# Patient Record
Sex: Female | Born: 1945 | Race: Black or African American | Hispanic: No | Marital: Single | Smoking: Former smoker
Health system: Southern US, Community
[De-identification: ages and names within clinical notes are randomized; demographics above are authoritative.]

## PROBLEM LIST (undated history)

## (undated) DIAGNOSIS — E538 Deficiency of other specified B group vitamins: Secondary | ICD-10-CM

## (undated) DIAGNOSIS — E785 Hyperlipidemia, unspecified: Secondary | ICD-10-CM

## (undated) DIAGNOSIS — F17200 Nicotine dependence, unspecified, uncomplicated: Secondary | ICD-10-CM

## (undated) DIAGNOSIS — F0390 Unspecified dementia without behavioral disturbance: Secondary | ICD-10-CM

## (undated) DIAGNOSIS — J984 Other disorders of lung: Secondary | ICD-10-CM

## (undated) DIAGNOSIS — R7989 Other specified abnormal findings of blood chemistry: Secondary | ICD-10-CM

## (undated) HISTORY — DX: Deficiency of other specified B group vitamins: E53.8

## (undated) HISTORY — DX: Nicotine dependence, unspecified, uncomplicated: F17.200

## (undated) HISTORY — DX: Other specified abnormal findings of blood chemistry: R79.89

## (undated) HISTORY — PX: ROTATOR CUFF REPAIR: SHX139

## (undated) HISTORY — DX: Hyperlipidemia, unspecified: E78.5

## (undated) HISTORY — DX: Unspecified dementia, unspecified severity, without behavioral disturbance, psychotic disturbance, mood disturbance, and anxiety: F03.90

## (undated) HISTORY — DX: Other disorders of lung: J98.4

---

## 1999-02-08 ENCOUNTER — Ambulatory Visit (HOSPITAL_COMMUNITY): Admission: RE | Admit: 1999-02-08 | Discharge: 1999-02-08 | Payer: Self-pay | Admitting: Family Medicine

## 1999-02-08 ENCOUNTER — Encounter: Payer: Self-pay | Admitting: Family Medicine

## 1999-02-22 ENCOUNTER — Encounter: Admission: RE | Admit: 1999-02-22 | Discharge: 1999-03-29 | Payer: Self-pay | Admitting: *Deleted

## 1999-03-28 ENCOUNTER — Ambulatory Visit (HOSPITAL_COMMUNITY): Admission: RE | Admit: 1999-03-28 | Discharge: 1999-03-28 | Payer: Self-pay

## 2002-08-20 ENCOUNTER — Ambulatory Visit (HOSPITAL_COMMUNITY): Admission: RE | Admit: 2002-08-20 | Discharge: 2002-08-20 | Payer: Self-pay | Admitting: Internal Medicine

## 2002-08-20 ENCOUNTER — Encounter: Payer: Self-pay | Admitting: *Deleted

## 2003-07-11 ENCOUNTER — Other Ambulatory Visit: Admission: RE | Admit: 2003-07-11 | Discharge: 2003-07-11 | Payer: Self-pay | Admitting: Family Medicine

## 2009-08-25 ENCOUNTER — Observation Stay (HOSPITAL_COMMUNITY): Admission: EM | Admit: 2009-08-25 | Discharge: 2009-08-27 | Payer: Self-pay | Admitting: Emergency Medicine

## 2009-08-25 ENCOUNTER — Encounter (INDEPENDENT_AMBULATORY_CARE_PROVIDER_SITE_OTHER): Payer: Self-pay | Admitting: Internal Medicine

## 2009-08-25 ENCOUNTER — Ambulatory Visit: Payer: Self-pay | Admitting: Vascular Surgery

## 2009-08-25 ENCOUNTER — Ambulatory Visit: Payer: Self-pay | Admitting: Cardiology

## 2009-08-27 ENCOUNTER — Encounter: Payer: Self-pay | Admitting: Cardiovascular Disease

## 2009-09-22 DIAGNOSIS — J449 Chronic obstructive pulmonary disease, unspecified: Secondary | ICD-10-CM | POA: Insufficient documentation

## 2009-09-22 DIAGNOSIS — R55 Syncope and collapse: Secondary | ICD-10-CM | POA: Insufficient documentation

## 2009-09-22 DIAGNOSIS — F17200 Nicotine dependence, unspecified, uncomplicated: Secondary | ICD-10-CM

## 2009-09-29 ENCOUNTER — Encounter (INDEPENDENT_AMBULATORY_CARE_PROVIDER_SITE_OTHER): Payer: Self-pay | Admitting: *Deleted

## 2010-03-06 NOTE — Letter (Signed)
Summary: [facility]   Imported By: Roderic Ovens 09/11/2009 12:16:20  _____________________________________________________________________  External Attachment:    Type:   Image     Comment:   External Document  Appended Document: [facility]  make sure patient gets a 30 day event monitor and F/U with EPS within 4 weeks  Appended Document: [facility]  Left message to call back    Appended Document: [facility]  unable to reach pt or leave a message

## 2010-03-06 NOTE — Letter (Signed)
Summary: Appointment - Missed  [facility], Main Office  [address]   [place], [place] [address]   Phone: [phone]  Fax: [phone]     September 29, 2009 MRN: [id number]   SUSETTE SEMINARA [address] [place], [place]  [address]   Dear Ms. Vigorito,  Our records indicate you missed your appointment on August 23rd, 2011, with Dr.  Ladona Ridgel. It is very important that we reach you to reschedule this appointment. We look forward to participating in your health care needs. Please contact us at the number listed above at your earliest convenience to reschedule this appointment.     Sincerely,    Glass blower/designer

## 2010-04-21 LAB — COMPREHENSIVE METABOLIC PANEL
AST: 15 U/L (ref 0–37)
AST: 19 U/L (ref 0–37)
Albumin: 3.7 g/dL (ref 3.5–5.2)
BUN: 11 mg/dL (ref 6–23)
CO2: 24 mEq/L (ref 19–32)
Chloride: 105 mEq/L (ref 96–112)
Creatinine, Ser: 0.94 mg/dL (ref 0.4–1.2)
Creatinine, Ser: 1.05 mg/dL (ref 0.4–1.2)
GFR calc Af Amer: >60 mL/min (ref >60)
GFR calc Af Amer: >60 mL/min (ref >60)
GFR calc non Af Amer: >60 mL/min (ref >60)
Glucose, Bld: 150 mg/dL — ABNORMAL HIGH (ref 70–99)
Total Bilirubin: 1.5 mg/dL — ABNORMAL HIGH (ref 0.3–1.2)
Total Protein: 7.1 g/dL (ref 6.0–8.3)

## 2010-04-21 LAB — CBC
HCT: 39.9 % (ref 36.0–46.0)
Hemoglobin: 13.4 g/dL (ref 12.0–15.0)
MCH: 32.4 pg (ref 26.0–34.0)
MCH: 32.8 pg (ref 26.0–34.0)
MCV: 96.1 fL (ref 78.0–100.0)
MCV: 96.1 fL (ref 78.0–100.0)
Platelets: 242 10*3/uL (ref 150–400)
RBC: 4.15 MIL/uL (ref 3.87–5.11)
RDW: 12.4 % (ref 11.5–15.5)
WBC: 9 10*3/uL (ref 4.0–10.5)

## 2010-04-21 LAB — CARDIAC PANEL(CRET KIN+CKTOT+MB+TROPI)
CK, MB: 0.6 ng/mL (ref 0.3–4.0)
Relative Index: INVALID (ref 0.0–2.5)
Relative Index: INVALID (ref 0.0–2.5)
Total CK: 59 U/L (ref 7–177)
Troponin I: 0.01 ng/mL (ref 0.00–0.06)

## 2010-04-21 LAB — POCT CARDIAC MARKERS
CKMB, poc: <1 ng/mL — ABNORMAL LOW (ref 1.0–8.0)
CKMB, poc: <1 ng/mL — ABNORMAL LOW (ref 1.0–8.0)
Myoglobin, poc: 92.6 ng/mL (ref 12–200)
Troponin i, poc: <0.05 ng/mL (ref 0.00–0.09)

## 2010-04-21 LAB — DIFFERENTIAL
Eosinophils Relative: 1 % (ref 0–5)
Lymphocytes Relative: 33 % (ref 12–46)
Lymphs Abs: 3 10*3/uL (ref 0.7–4.0)
Monocytes Absolute: 0.6 10*3/uL (ref 0.1–1.0)
Monocytes Relative: 7 % (ref 3–12)
Neutro Abs: 5.3 10*3/uL (ref 1.7–7.7)

## 2015-03-01 ENCOUNTER — Other Ambulatory Visit: Payer: Self-pay

## 2015-03-01 DIAGNOSIS — R634 Abnormal weight loss: Secondary | ICD-10-CM | POA: Insufficient documentation

## 2015-03-01 DIAGNOSIS — J410 Simple chronic bronchitis: Secondary | ICD-10-CM | POA: Insufficient documentation

## 2015-03-01 DIAGNOSIS — G3184 Mild cognitive impairment, so stated: Secondary | ICD-10-CM | POA: Insufficient documentation

## 2015-03-01 DIAGNOSIS — Z1231 Encounter for screening mammogram for malignant neoplasm of breast: Secondary | ICD-10-CM

## 2015-03-01 DIAGNOSIS — Z7189 Other specified counseling: Secondary | ICD-10-CM | POA: Insufficient documentation

## 2015-03-08 DIAGNOSIS — E782 Mixed hyperlipidemia: Secondary | ICD-10-CM | POA: Insufficient documentation

## 2015-03-09 ENCOUNTER — Ambulatory Visit: Payer: Self-pay

## 2015-04-14 DIAGNOSIS — M8580 Other specified disorders of bone density and structure, unspecified site: Secondary | ICD-10-CM | POA: Insufficient documentation

## 2015-09-26 ENCOUNTER — Ambulatory Visit: Payer: Self-pay | Admitting: General Surgery

## 2015-09-26 NOTE — H&P (Signed)
History of Present Illness  The patient is a 70 year old female who presents with a colonic polyp. Patient is a 70 year old female who is referred by Dr. Penelope Coop for evaluation of a cecal polyp. Patient had a screening colonoscopy last week. Patient was seen have a 30 mm Like polyp in the cecum. This was biopsied. This revealed tubular adenoma, no high-grade dysplasia or malignancy identified. Patient also had colonic polyps the hepatic flexure, transverse, sigmoid, rectum which revealed to be tubular adenomas no high-grade dysplasia or malignancy identified.  Secondary to the fact that again was unable to completely resect the prominent cecum she is referred for surgical excision.   Allergies ( No Known Drug Allergies08/22/2017  Medication History ( Atorvastatin Calcium (10MG  Tablet, Oral) Active. Donepezil HCl (10MG  Tablet, Oral) Active. Medications Reconciled  Vitals ( 09/26/2015 2:09 PM Weight: 144 lb Height: 65in Body Surface Area: 1.72 m Body Mass Index: 23.96 kg/m  Temp.: 98.60F(Temporal)  Pulse: 79 (Regular)  BP: 126/80 (Sitting, Left Arm, Standard)       Physical Exam  General Mental Status-Alert. General Appearance-Consistent with stated age. Hydration-Well hydrated. Voice-Normal.  Chest and Lung Exam Chest and lung exam reveals -quiet, even and easy respiratory effort with no use of accessory muscles and on auscultation, normal breath sounds, no adventitious sounds and normal vocal resonance. Inspection Chest Wall - Normal. Back - normal.  Cardiovascular Cardiovascular examination reveals -normal heart sounds, regular rate and rhythm with no murmurs and normal pedal pulses bilaterally.  Abdomen Inspection Inspection of the abdomen reveals - No Hernias. Skin - Scar - no surgical scars. Palpation/Percussion Palpation and Percussion of the abdomen reveal - Soft, Non Tender, No Rebound tenderness, No Rigidity (guarding) and No  hepatosplenomegaly. Auscultation Auscultation of the abdomen reveals - Bowel sounds normal.  Musculoskeletal Normal Exam - Left-Upper Extremity Strength Normal and Lower Extremity Strength Normal. Normal Exam - Right-Upper Extremity Strength Normal and Lower Extremity Strength Normal.    Assessment & Plan ( POLYP OF CECUM (D12.0) Impression: 70 year old female with a sessile cecal polyp  1. This time we will proceed to the operating for right hemicolectomy, this will be laparoscopic versus robotically. 2. I discussed with the patient the risks and benefits of the procedure to include but not limited to: Infection, bleeding, damage to any structures, possible need for further surgery, leakage of anastomosis and abscess. Patient was understanding and wishes to proceed. 3. Patient will be given a bowel prep with antibiotics.

## 2015-10-31 ENCOUNTER — Inpatient Hospital Stay (HOSPITAL_COMMUNITY): Admission: RE | Admit: 2015-10-31 | Payer: Medicare Other | Source: Ambulatory Visit | Admitting: General Surgery

## 2015-10-31 ENCOUNTER — Encounter (HOSPITAL_COMMUNITY): Admission: RE | Payer: Self-pay | Source: Ambulatory Visit

## 2015-10-31 SURGERY — LAPAROSCOPIC RIGHT HEMI COLECTOMY
Anesthesia: General

## 2016-07-10 DIAGNOSIS — R928 Other abnormal and inconclusive findings on diagnostic imaging of breast: Secondary | ICD-10-CM | POA: Insufficient documentation

## 2016-07-25 ENCOUNTER — Ambulatory Visit (INDEPENDENT_AMBULATORY_CARE_PROVIDER_SITE_OTHER): Payer: Medicare Other | Admitting: Neurology

## 2016-07-25 ENCOUNTER — Encounter: Payer: Self-pay | Admitting: Neurology

## 2016-07-25 VITALS — BP 114/78 | HR 69 | Ht 65.0 in | Wt 146.0 lb

## 2016-07-25 DIAGNOSIS — F17200 Nicotine dependence, unspecified, uncomplicated: Secondary | ICD-10-CM

## 2016-07-25 DIAGNOSIS — F0150 Vascular dementia without behavioral disturbance: Secondary | ICD-10-CM | POA: Diagnosis not present

## 2016-07-25 DIAGNOSIS — Z789 Other specified health status: Secondary | ICD-10-CM

## 2016-07-25 NOTE — Progress Notes (Signed)
Subjective:    Patient ID: Colleen David is a 71 y.o. female.  HPI     Star Age, MD, PhD [facility] [address] [place], [place] [address]  Dear Colleen David,   I saw your patient, Colleen David, upon your kind request in my neurologic clinic today for initial consultation of her memory loss. The patient is accompanied by her sister Colleen David, youngest sibling) and her cousin today. As you know, Colleen David is a 71 year old right-handed woman with an underlying medical history of vitamin B12 deficiency, smoking, marijuana use (none in the past year), and chronic lung disease, who is reported to have memory loss for the past 9-10 years, per sister, since patient's daughter died at age 31 from a car accident. She has 1 son who lives with her. She is a retired Customer service manager. She still smokes, half a pack per day and has reduced her smoking. Of note, her sister noticed to nicotine patches on her arms today. Patient does not have a history of behavioral disturbance her anger outbursts, no history of delusions, no history of wandering, got locked out one time outside her house.   I reviewed your office note from 05/01/2016, which you kindly included. She was referred to neurocognitive testing at the time. She has been on Namzaric, currently 28/10 mg daily. She is the oldest of a total of 7 siblings, 5 are living. One sister died in infancy from pneumonia, one brother died in his early 60s from a heart attack. She is unable to provide her own history. As I understand, there was concern for appetite loss and depressed mood. Of note, she had a brain MRI without contrast on 08/25/2009 which I reviewed. IMPRESSION:  Mild atrophy and small vessel disease.  No acute intracranial findings. This was done after a syncopal episode while eating. No other hx of syncope since then. She denies frank depression, but sister is concerned about depression.  She has not always  been eating right, had lost weight, now more stable. She no longer drives for about 10 years, no longer cooks for the past few years. Does not like other people's cooking, even her son's. She has not fallen, no VH/AH. She has no family history of dementia. She has been on Namzaric for about a year. She does not always drink enough water. She drinks 4 bottles of Pepsi per day, 12 ounces each. She does not drink any alcohol.  Her Past Medical History Is Significant For: Past Medical History:  Diagnosis Date  . Dementia   . Low vitamin B12 level   . Nicotine dependence   . Restrictive airway disease     Her Past Surgical History Is Significant For: Past Surgical History:  Procedure Laterality Date  . ROTATOR CUFF REPAIR      Her Family History Is Significant For: Family History  Problem Relation Age of Onset  . Lung cancer Mother   . Diabetes Mother   . Hypertension Mother   . Colon cancer Mother   . Alcohol abuse Father   . Hypertension Sister     Her Social History Is Significant For: Social History   Social History  . Marital status: Married    Spouse name: N/A  . Number of children: N/A  . Years of education: N/A   Social History Main Topics  . Smoking status: Not on file  . Smokeless tobacco: Not on file  . Alcohol use Not on file  .  Drug use: Unknown  . Sexual activity: Not on file   Other Topics Concern  . Not on file   Social History Narrative  . No narrative on file    Her Allergies Are:  Not on File:   Her Current Medications Are:  Outpatient Encounter Prescriptions as of 07/25/2016  Medication Sig  . alendronate (FOSAMAX) 70 MG tablet Take 70 mg by mouth once a week. Take with a full glass of water on an empty stomach.  Marland Kitchen aspirin EC 81 MG tablet Take 81 mg by mouth daily.  Marland Kitchen atorvastatin (LIPITOR) 10 MG tablet Take 10 mg by mouth daily.  . Memantine HCl-Donepezil HCl (NAMZARIC) 28-10 MG CP24 Take 1 capsule by mouth daily.  . nicotine (NICODERM CQ -  DOSED IN MG/24 HR) 7 mg/24hr patch Place 7 mg onto the skin daily.  . varenicline (CHANTIX) 1 MG tablet Take 1 mg by mouth 2 (two) times daily.   No facility-administered encounter medications on file as of 07/25/2016.   : Review of Systems:  Out of a complete 14 point review of systems, all are reviewed and negative with the exception of these symptoms as listed below:  Review of Systems  Neurological:       Pt presents today as a new pt with new onset dementia. The sister states that long term memory is intact but that her short term memory is off.     Objective:  Neurological Exam  Physical Exam Physical Examination:   Vitals:   07/25/16 0951  BP: 114/78  Pulse: 69   General Examination: The patient is a very pleasant 71 y.o. female in no acute distress. She appears well-developed and well-nourished and adequately groomed.   HEENT: Normocephalic, atraumatic, pupils are equal, round and reactive to light and accommodation. Funduscopic exam is normal with sharp disc margins noted. Extraocular tracking is good without limitation to gaze excursion or nystagmus noted. Normal smooth pursuit is noted. Hearing is grossly intact. Tympanic membranes are clear bilaterally. Face is symmetric with normal facial animation and normal facial sensation. Speech is clear with no dysarthria noted. There is no hypophonia. There is no lip, neck/head, jaw or voice tremor. Neck is supple with full range of passive and active motion. There are no carotid bruits on auscultation. Oropharynx exam reveals: mild mouth dryness, adequate dental hygiene with dentures on top, few teeth in the bottom, no partials. Mallampati is class III. Tongue protrudes centrally and palate elevates symmetrically.   Chest: Clear to auscultation without wheezing, rhonchi or crackles noted.  Heart: S1+S2+0, regular and normal without murmurs, rubs or gallops noted.   Abdomen: Soft, non-tender and non-distended with normal bowel  sounds appreciated on auscultation.  Extremities: There is no pitting edema in the distal lower extremities bilaterally But nonpitting puffiness around both ankles, slender legs.  Skin: Warm and dry without trophic changes noted.  Musculoskeletal: exam reveals no obvious joint deformities, tenderness or joint swelling or erythema.   Neurologically:  Mental status: The patient is awake, not always fully alert during the appointment, seems to have dosed off one time during the conversation. She is unable to provide her own history. She is quiet, minimally verbal. Answers in few word sentences. She is not fully oriented, oriented to city, county, state, self and season, not otherwise. Speech is clear with normal prosody and enunciation. Thought process is linear. Mood is constricted and affect is blunted.   On 07/25/2016: MMSE: 19/30, CDT: 2/4, AFT: 6/min.   Cranial nerves II -  XII are as described above under HEENT exam. In addition: shoulder shrug is normal with equal shoulder height noted. Motor exam: Normal bulk, strength and tone is noted for age. There is no drift, tremor or rebound. Romberg is not tested for safety. Fine motor skills  are grossly intact for age. She has no intention tremor.   Sensory exam: intact to light touch.  Gait, station and balance: She stands without difficulty, posture is age-appropriate, she walks without difficulty, preserved arm swing.  Assessment and plan:   In summary, Colleen David is a very pleasant 71 y.o.-year old female with an underlying medical history of vitamin B12 deficiency, smoking, marijuana use (none in the past year), and chronic lung disease, who has had memory loss for the past 9 or 10 years. Her history and family history is not compelling for Alzheimer's disease but she does have vascular risk factors and is at risk for vascular dementia without obvious behavioral disturbance, she does appear to be somewhat depressed. She may benefit from  management of depression symptomatically. She has been on Namzaric for the past year and is encouraged to continue with this. She no longer drives or call. I would agree with this. She has good family support, lives with her son. I would like to proceed with a brain MRI without contrast as well as referral to neuropsychology for cognitive testing. She does not appear to have a referral in place for this.  I had a long chat with the patient and her family about my findings and the diagnosis of memory loss and dementia, its prognosis and treatment options. We talked about medical treatments and non-pharmacological approaches. We talked about smoking cessation and about maintaining a healthy lifestyle in general and staying active mentally and physically. I encouraged the patient to eat healthy, exercise daily and keep well hydrated, to keep a scheduled bedtime and wake time routine, to not skip any meals and eat healthy snacks in between meals and to have protein with every meal. I stressed the importance of regular exercise, within of course the patient's own mobility limitations. I encouraged the patient to keep up with current events by reading the news paper or watching the news and to do word puzzles, or if feasible, to go on [email or web address].   As far as further diagnostic testing is concerned, I suggested the following: MRI brain without contrast, formal memory testing in the form of neuropsychological evaluation.  As far as medications are concerned, I recommended the following at this time: no change. She is advised to quit smoking, she is furthermore advised to not put more than 1 patch of nicotine on her body and she is encouraged to drink more water, less soda. I advised her to try to walk daily for exercise. I suggested a 4 month checkup, she can see one of our nurse practitioners at the time. I answered all their questions today and the patient and her sister Colleen David and her cousin were in agreement.    Thank you very much for allowing me to participate in the care of this nice patient. If I can be of any further assistance to you please do not hesitate to call me at [phone].  Sincerely,   Star Age, MD, PhD

## 2016-07-25 NOTE — Patient Instructions (Addendum)
Please stop smoking.  Please drink more water, less soda.  Exercise daily in the form of walking.  We will do a brain scan, called MRI and call you with the test results. We will have to schedule you for this on a separate date. This test requires authorization from your insurance, and we will take care of the insurance process. We will request a formal neuropsychological test (aka cognitive testing) for your memory complaints. This requires a referral to a trained and licensed neuropsychologist and will be a separate appointment at a different clinic.  For symptoms of depression, talk to Neoma Laming about treatment options.  Continue current medications. Try to stay active mentally and physically.

## 2016-09-30 DIAGNOSIS — E538 Deficiency of other specified B group vitamins: Secondary | ICD-10-CM | POA: Insufficient documentation

## 2016-11-26 ENCOUNTER — Ambulatory Visit: Payer: Medicare Other | Admitting: Adult Health

## 2017-05-19 ENCOUNTER — Other Ambulatory Visit: Payer: Self-pay | Admitting: Nurse Practitioner

## 2017-05-19 DIAGNOSIS — R921 Mammographic calcification found on diagnostic imaging of breast: Secondary | ICD-10-CM

## 2017-05-26 ENCOUNTER — Ambulatory Visit
Admission: RE | Admit: 2017-05-26 | Discharge: 2017-05-26 | Disposition: A | Discharge status: Home / Self Care | Payer: Medicare Other | Source: Ambulatory Visit | Attending: Nurse Practitioner | Admitting: Nurse Practitioner

## 2017-05-26 DIAGNOSIS — R921 Mammographic calcification found on diagnostic imaging of breast: Secondary | ICD-10-CM

## 2018-10-16 ENCOUNTER — Other Ambulatory Visit: Payer: Self-pay

## 2018-10-16 ENCOUNTER — Ambulatory Visit (INDEPENDENT_AMBULATORY_CARE_PROVIDER_SITE_OTHER): Payer: Medicare Other | Admitting: Podiatry

## 2018-10-16 ENCOUNTER — Other Ambulatory Visit: Payer: Self-pay | Admitting: Podiatry

## 2018-10-16 ENCOUNTER — Ambulatory Visit (INDEPENDENT_AMBULATORY_CARE_PROVIDER_SITE_OTHER): Payer: Medicare Other

## 2018-10-16 DIAGNOSIS — M2042 Other hammer toe(s) (acquired), left foot: Secondary | ICD-10-CM

## 2018-10-16 DIAGNOSIS — M79609 Pain in unspecified limb: Secondary | ICD-10-CM

## 2018-10-16 DIAGNOSIS — D1723 Benign lipomatous neoplasm of skin and subcutaneous tissue of right leg: Secondary | ICD-10-CM | POA: Diagnosis not present

## 2018-10-16 DIAGNOSIS — M79672 Pain in left foot: Secondary | ICD-10-CM

## 2018-10-16 DIAGNOSIS — D1724 Benign lipomatous neoplasm of skin and subcutaneous tissue of left leg: Secondary | ICD-10-CM

## 2018-10-16 DIAGNOSIS — M79676 Pain in unspecified toe(s): Secondary | ICD-10-CM

## 2018-10-16 DIAGNOSIS — B351 Tinea unguium: Secondary | ICD-10-CM

## 2018-10-16 DIAGNOSIS — M79671 Pain in right foot: Secondary | ICD-10-CM

## 2018-10-16 DIAGNOSIS — M2041 Other hammer toe(s) (acquired), right foot: Secondary | ICD-10-CM

## 2018-11-04 NOTE — Progress Notes (Signed)
  Subjective:  Patient ID: Colleen David, female    DOB: 1945/07/10,  MRN: [id number]  Chief Complaint  Patient presents with  . Foot Problem    Pt guardian states ankles have always been swollen, but does not cause any pain.  . Nail Problem    Nail trim 1-5 bilateral    73 y.o. female presents with the above complaint.  Reports painfully elongated nails to both feet.  Review of Systems: Negative except as noted in the HPI. Denies N/V/F/Ch.  Past Medical History:  Diagnosis Date  . Dementia   . Low vitamin B12 level   . Nicotine dependence   . Restrictive airway disease     Current Outpatient Medications:  .  albuterol (VENTOLIN HFA) 108 (90 Base) MCG/ACT inhaler, Inhale into the lungs., Disp: , Rfl:  .  alendronate (FOSAMAX) 70 MG tablet, Take 70 mg by mouth once a week. Take with a full glass of water on an empty stomach., Disp: , Rfl:  .  aspirin EC 81 MG tablet, Take 81 mg by mouth daily., Disp: , Rfl:  .  atorvastatin (LIPITOR) 10 MG tablet, Take 10 mg by mouth daily., Disp: , Rfl:  .  cyanocobalamin (,VITAMIN B-12,) 1000 MCG/ML injection, Inject into the muscle., Disp: , Rfl:  .  Memantine HCl-Donepezil HCl (NAMZARIC) 28-10 MG CP24, Take 1 capsule by mouth daily., Disp: , Rfl:  .  triamcinolone (KENALOG) 0.025 % ointment, Use bid sparingly to affected areas x 7 days, Disp: , Rfl:  .  nicotine (NICODERM CQ - DOSED IN MG/24 HR) 7 mg/24hr patch, Place 7 mg onto the skin daily., Disp: , Rfl:  .  varenicline (CHANTIX) 1 MG tablet, Take 1 mg by mouth 2 (two) times daily., Disp: , Rfl:   Social History   Tobacco Use  Smoking Status Current Every Day Smoker  . Packs/day: 0.50  . Types: Cigarettes  Smokeless Tobacco Never Used    No Known Allergies Objective:  There were no vitals filed for this visit. There is no height or weight on file to calculate BMI. Constitutional Well developed. Well nourished.  Vascular Dorsalis pedis pulses palpable bilaterally. Posterior  tibial pulses palpable bilaterally. Capillary refill normal to all digits.  No cyanosis or clubbing noted. Pedal hair growth normal.  Neurologic Normal speech. Oriented to person, place, and time. Epicritic sensation to light touch grossly present bilaterally.  Dermatologic Nails elongated dystrophic pain to palpation No open wounds. No skin lesions.  Orthopedic: Normal joint ROM without pain or crepitus bilaterally. No visible deformities. No bony tenderness.   Radiographs: None Assessment:   1. Pain due to onychomycosis of nail   2. Hammertoe of left foot   3. Hammertoe of right foot    Plan:  Patient was evaluated and treated and all questions answered.  Onychomycosis with pain -Nails palliatively debridement as below -Educated on self-care  Procedure: Nail Debridement Rationale: Pain Type of Debridement: manual, sharp debridement. Instrumentation: Nail nipper, rotary burr. Number of Nails: 10    Return in about 3 months (around 01/15/2019) for Routine Foot Care (needs ABN next visit).

## 2019-01-15 ENCOUNTER — Ambulatory Visit: Payer: Medicare Other | Admitting: Podiatry

## 2019-03-05 ENCOUNTER — Ambulatory Visit: Payer: Medicare Other | Admitting: Podiatry

## 2019-06-02 ENCOUNTER — Ambulatory Visit: Payer: Self-pay | Admitting: Cardiology

## 2019-06-08 ENCOUNTER — Ambulatory Visit: Payer: Medicare Other | Admitting: Cardiology

## 2019-06-08 ENCOUNTER — Other Ambulatory Visit: Payer: Self-pay

## 2019-06-08 ENCOUNTER — Encounter: Payer: Self-pay | Admitting: Cardiology

## 2019-06-08 VITALS — BP 108/62 | HR 70 | Temp 98.6°F | Resp 18 | Ht 65.0 in | Wt 146.4 lb

## 2019-06-08 DIAGNOSIS — J449 Chronic obstructive pulmonary disease, unspecified: Secondary | ICD-10-CM

## 2019-06-08 DIAGNOSIS — I452 Bifascicular block: Secondary | ICD-10-CM

## 2019-06-08 DIAGNOSIS — Z87891 Personal history of nicotine dependence: Secondary | ICD-10-CM

## 2019-06-08 DIAGNOSIS — E782 Mixed hyperlipidemia: Secondary | ICD-10-CM

## 2019-06-08 DIAGNOSIS — R0602 Shortness of breath: Secondary | ICD-10-CM

## 2019-06-08 NOTE — Progress Notes (Signed)
Date:  06/08/2019   ID:  Demetric, [place] 20-Apr-1945, MRN [id number]  PCP:  Smothers, Andree Elk, NP  Cardiologist:  Rex Kras, DO, [facility] (established care 06/08/2019)  REASON FOR CONSULT: Shortness of breath and abnormal EKG  REQUESTING PHYSICIAN:  Smothers, Andree Elk, NP [address] [place],  [place] [address]  Chief Complaint  Patient presents with  . Shortness of Breath  . Abnormal ECG  . New Patient (Initial Visit)    Referral from Eldridge Abrahams, FNP    HPI  Colleen David is a 74 y.o. female who is being seen today for the evaluation of shortness of breath and abnormal EKG at the request of Smothers, Andree Elk, NP. Patient's past medical history and cardiac risk factors include: Former smoker, COPD, right bundle branch block, left anterior fascicular block, mixed hyperlipidemia, postmenopausal female, advanced age.  Patient is accompanied by her Preston Fleeting at today's office visit.  Patient provides verbal consent in regards to having her present during the office encounter.  Patient is referred to the office at the request of her primary provider for the above-mentioned chief complaint and reason of consultation.  According to the patient in March 2021 she was working in the yard and sat down and she thought that she had dozed off but when her aide came by to check up on her she was difficult to arouse.  Patient's aide got concerned called EMS.  She had an EKG and vitals performed and did not go ER for further evaluation.  During that time she did not have any chest pain or shortness of breath at rest or with effort related activities.  And since that episode she has not had any additional events.  At the time of the evaluation today patient denies any chest pain or shortness of breath at rest or with effort related activities.  Patient tries to make an effort to walk a block every day without any effort related symptoms.  Denies prior history of coronary  artery disease, myocardial infarction, congestive heart failure, deep venous thrombosis, pulmonary embolism, stroke, transient ischemic attack.  FUNCTIONAL STATUS: Goes everyday with an aide which is about a block.    ALLERGIES: No Known Allergies  MEDICATION LIST PRIOR TO VISIT: Current Meds  Medication Sig  . albuterol (VENTOLIN HFA) 108 (90 Base) MCG/ACT inhaler Inhale into the lungs.  Marland Kitchen alendronate (FOSAMAX) 70 MG tablet Take 70 mg by mouth once a week. Take with a full glass of water on an empty stomach.  Marland Kitchen aspirin EC 81 MG tablet Take 81 mg by mouth daily.  Marland Kitchen atorvastatin (LIPITOR) 10 MG tablet Take 10 mg by mouth daily.  . Cyanocobalamin (VITAMIN B-12) 1000 MCG SUBL Place under the tongue every 30 (thirty) days.  . Memantine HCl-Donepezil HCl (NAMZARIC) 28-10 MG CP24 Take 1 capsule by mouth daily.  . Multiple Vitamin (MULTIVITAMIN ADULT PO) Take 1 tablet by mouth daily.     PAST MEDICAL HISTORY: Past Medical History:  Diagnosis Date  . Dementia ([place])   . Hyperlipidemia   . Low vitamin B12 level   . Nicotine dependence   . Restrictive airway disease     PAST SURGICAL HISTORY: Past Surgical History:  Procedure Laterality Date  . ROTATOR CUFF REPAIR      FAMILY HISTORY: The patient family history includes Alcohol abuse in her father; Colon cancer in her mother; Diabetes in her mother; Hypertension in her mother, sister, and sister; Lung cancer in  her mother.  SOCIAL HISTORY:  The patient  reports that she quit smoking about 16 months ago. Her smoking use included cigarettes. She has a 27.50 pack-year smoking history. She has never used smokeless tobacco. She reports that she does not drink alcohol or use drugs.  REVIEW OF SYSTEMS: Review of Systems  Constitution: Negative for chills and fever.  HENT: Negative for hoarse voice and nosebleeds.   Eyes: Negative for discharge, double vision and pain.  Cardiovascular: Negative for chest pain, claudication, dyspnea on  exertion, leg swelling, near-syncope, orthopnea, palpitations, paroxysmal nocturnal dyspnea and syncope.  Respiratory: Positive for shortness of breath. Negative for hemoptysis.   Musculoskeletal: Negative for muscle cramps and myalgias.  Gastrointestinal: Negative for abdominal pain, constipation, diarrhea, hematemesis, hematochezia, melena, nausea and vomiting.  Neurological: Negative for dizziness and light-headedness.    PHYSICAL EXAM: Vitals with BMI 06/08/2019 07/25/2016  Height '5\' 5"'  '5\' 5"'   Weight 146 lbs 6 oz 146 lbs  BMI 01.60 10.9  Systolic 323 557  Diastolic 62 78  Pulse 70 69   CONSTITUTIONAL: Well-developed and well-nourished. No acute distress.  SKIN: Skin is warm and dry. No rash noted. No cyanosis. No pallor. No jaundice HEAD: Normocephalic and atraumatic.  EYES: No scleral icterus MOUTH/THROAT: Moist oral membranes.  NECK: No JVD present. No thyromegaly noted. No carotid bruits  LYMPHATIC: No visible cervical adenopathy.  CHEST Normal respiratory effort. No intercostal retractions  LUNGS: Clear to auscultation bilaterally.  No stridor. No wheezes. No rales.  CARDIOVASCULAR: Regular rate and rhythm, positive S1-S2, no murmurs rubs or gallops appreciated ABDOMINAL: Soft, nontender, nondistended, positive bowel sounds in all 4 quadrants, no apparent ascites.  EXTREMITIES: No peripheral edema  HEMATOLOGIC: No significant bruising NEUROLOGIC: Oriented to person, place, and time. Nonfocal. Normal muscle tone.  PSYCHIATRIC: Normal mood and affect. Normal behavior. Cooperative  CARDIAC DATABASE: EKG: 06/08/2019: Normal sinus rhythm, 77 bpm, left axis deviation, right bundle branch block, left anterior fascicular block, left atrial enlargement, ST-T changes secondary to RBBB.  Echocardiogram: None  Stress Testing: None  Heart Catheterization: None  LABORATORY DATA:  External Labs: [facility] Collected: 05/13/2019 Creatinine 1.36 mg/dL. eGFR:  45 mL/min per 1.73 m Lipid profile: Total cholesterol 174, triglycerides 72, HDL 60, LDL 100, non-HDL 114  IMPRESSION:    ICD-10-CM   1. Shortness of breath  R06.02 EKG 12-Lead    PCV ECHOCARDIOGRAM COMPLETE    PCV MYOCARDIAL PERFUSION WITH LEXISCAN  2. RBBB (right bundle branch block with left anterior fascicular block)  I45.2 PCV MYOCARDIAL PERFUSION WITH LEXISCAN  3. Mixed hyperlipidemia  E78.2   4. Former smoker  Z87.891   5. Chronic obstructive pulmonary disease, unspecified COPD type ([place])  J44.9      RECOMMENDATIONS: Colleen David is a 74 y.o. female whose past medical history and cardiac risk factors include: Former smoker, COPD, right bundle branch block, left anterior fascicular block, mixed hyperlipidemia, postmenopausal female, advanced age.  Shortness of breath:  Patient is symptoms of shortness of breath are not effort related and could be secondary to her underlying chronic obstructive pulmonary disease.  However, patient has multiple cardiovascular risk factors for which I would recommend an ischemic evaluation.  Echocardiogram will be ordered to evaluate for structural heart disease and left ventricular systolic function.  Nuclear stress test recommended to evaluate for reversible ischemia.  Mixed hyperlipidemia: Continue statin therapy.  Most recent lipid profile reviewed.  Currently managed by primary team.  Former smoker: Educated on the importance of  continued smoking cessation.  FINAL MEDICATION LIST END OF ENCOUNTER: No orders of the defined types were placed in this encounter.   There are no discontinued medications.   Current Outpatient Medications:  .  albuterol (VENTOLIN HFA) 108 (90 Base) MCG/ACT inhaler, Inhale into the lungs., Disp: , Rfl:  .  alendronate (FOSAMAX) 70 MG tablet, Take 70 mg by mouth once a week. Take with a full glass of water on an empty stomach., Disp: , Rfl:  .  aspirin EC 81 MG tablet, Take 81 mg by mouth daily., Disp: ,  Rfl:  .  atorvastatin (LIPITOR) 10 MG tablet, Take 10 mg by mouth daily., Disp: , Rfl:  .  Cyanocobalamin (VITAMIN B-12) 1000 MCG SUBL, Place under the tongue every 30 (thirty) days., Disp: , Rfl:  .  Memantine HCl-Donepezil HCl (NAMZARIC) 28-10 MG CP24, Take 1 capsule by mouth daily., Disp: , Rfl:  .  Multiple Vitamin (MULTIVITAMIN ADULT PO), Take 1 tablet by mouth daily., Disp: , Rfl:   Orders Placed This Encounter  Procedures  . PCV MYOCARDIAL PERFUSION WITH LEXISCAN  . EKG 12-Lead  . PCV ECHOCARDIOGRAM COMPLETE   --Continue cardiac medications as reconciled in final medication list. --Return in about 6 weeks (around 07/20/2019) for re-evaluation of symptoms., review test results.. Or sooner if needed. --Continue follow-up with your primary care physician regarding the management of your other chronic comorbid conditions.  Patient's questions and concerns were addressed to her satisfaction. She voices understanding of the instructions provided during this encounter.   This note was created using a voice recognition software as a result there may be grammatical errors inadvertently enclosed that do not reflect the nature of this encounter. Every attempt is made to correct such errors.  Rex Kras, [place], [facility]  Pager: [phone] Office: [phone]

## 2019-06-25 ENCOUNTER — Ambulatory Visit: Payer: Medicare Other

## 2019-06-25 ENCOUNTER — Other Ambulatory Visit: Payer: Self-pay

## 2019-06-25 DIAGNOSIS — R0602 Shortness of breath: Secondary | ICD-10-CM

## 2019-06-28 ENCOUNTER — Other Ambulatory Visit: Payer: Self-pay

## 2019-06-28 ENCOUNTER — Ambulatory Visit: Payer: Medicare Other

## 2019-06-28 DIAGNOSIS — R0602 Shortness of breath: Secondary | ICD-10-CM

## 2019-06-28 DIAGNOSIS — I452 Bifascicular block: Secondary | ICD-10-CM

## 2019-07-02 ENCOUNTER — Other Ambulatory Visit: Payer: Medicare Other

## 2019-07-06 ENCOUNTER — Telehealth: Payer: Self-pay

## 2019-07-06 NOTE — Progress Notes (Signed)
Left vm to cb.

## 2019-07-06 NOTE — Telephone Encounter (Signed)
Left vm to cb.

## 2019-07-06 NOTE — Telephone Encounter (Signed)
-----   Message from [place], [place] sent at 07/04/2019 12:22 PM EDT ----- The left ventricular ejection fraction or pumping activity of the heart is within the normal limit.  Mildly impaired relaxation.  Details will be reviewed at the next office visit.

## 2019-07-09 NOTE — Progress Notes (Signed)
2nd attempt : Patient called, NA, LMAM.

## 2019-07-20 ENCOUNTER — Encounter: Payer: Self-pay | Admitting: Cardiology

## 2019-07-20 ENCOUNTER — Other Ambulatory Visit: Payer: Self-pay

## 2019-07-20 ENCOUNTER — Ambulatory Visit: Payer: Medicare Other | Admitting: Cardiology

## 2019-07-20 VITALS — BP 130/60 | HR 50 | Ht 65.0 in | Wt 148.0 lb

## 2019-07-20 DIAGNOSIS — R0602 Shortness of breath: Secondary | ICD-10-CM

## 2019-07-20 DIAGNOSIS — J449 Chronic obstructive pulmonary disease, unspecified: Secondary | ICD-10-CM

## 2019-07-20 DIAGNOSIS — Z87891 Personal history of nicotine dependence: Secondary | ICD-10-CM

## 2019-07-20 DIAGNOSIS — Z712 Person consulting for explanation of examination or test findings: Secondary | ICD-10-CM

## 2019-07-20 DIAGNOSIS — I452 Bifascicular block: Secondary | ICD-10-CM

## 2019-07-20 DIAGNOSIS — E782 Mixed hyperlipidemia: Secondary | ICD-10-CM

## 2019-07-20 NOTE — Progress Notes (Signed)
 Colleen David Date of Birth: 11/08/1945 MRN: [id number] Primary Care Provider:Jones, Penny L, NP Primary Cardiologist: Deano Tomaszewski, DO, FACC (established care 06/08/2019)    Date: 07/20/19 Last Office Visit: 06/08/2019  Chief Complaint  Patient presents with  . Shortness of Breath  . Follow-up    HPI  Colleen David is a 74 y.o. female who presents to the office with a  chief complaint of " reevaluation of symptoms and test results."  Her past medical history and cardiovascular risk factors are: Former smoker, COPD, right bundle branch block, left anterior fascicular block, mixed hyperlipidemia, postmenopausal female, advanced age.   Patient was originally referred to the office for evaluation of shortness of breath and abnormal EKG.   Patient is accompanied by her Sister Colleen David at today's office visit.  Patient provides verbal consent in regards to having her present during the office encounter.  Since then patient was recommended to undergo an ischemic evaluation.  She underwent an echocardiogram and nuclear stress test since last office visit and results were reviewed with her in great detail at today's visit.  And noted below for further reference.  Since last office visit her shortness of breath remains at baseline.  She does not have any chest pain at rest or with effort related activities.  Denies prior history of coronary artery disease, myocardial infarction, congestive heart failure, deep venous thrombosis, pulmonary embolism, stroke, transient ischemic attack.  ALLERGIES: No Known Allergies  MEDICATION LIST PRIOR TO VISIT: Current Meds  Medication Sig  . albuterol (VENTOLIN HFA) 108 (90 Base) MCG/ACT inhaler Inhale into the lungs.  . alendronate (FOSAMAX) 70 MG tablet Take 70 mg by mouth once a week. Take with a full glass of water on an empty stomach.  . aspirin EC 81 MG tablet Take 81 mg by mouth daily.  . atorvastatin (LIPITOR) 10 MG tablet Take 10 mg by  mouth daily.  . Cyanocobalamin (VITAMIN B-12) 1000 MCG SUBL Place under the tongue every 30 (thirty) days.  . Memantine HCl-Donepezil HCl (NAMZARIC) 28-10 MG CP24 Take 1 capsule by mouth daily.  . Multiple Vitamin (MULTIVITAMIN ADULT PO) Take 1 tablet by mouth daily.     PAST MEDICAL HISTORY: Past Medical History:  Diagnosis Date  . Dementia ([facility])   . Hyperlipidemia   . Low vitamin B12 level   . Nicotine dependence   . Restrictive airway disease     PAST SURGICAL HISTORY: Past Surgical History:  Procedure Laterality Date  . ROTATOR CUFF REPAIR      FAMILY HISTORY: The patient family history includes Alcohol abuse in her father; Colon cancer in her mother; Diabetes in her mother; Hypertension in her mother, sister, and sister; Lung cancer in her mother.  SOCIAL HISTORY:  The patient  reports that she quit smoking about 17 months ago. Her smoking use included cigarettes. She has a 27.50 pack-year smoking history. She has never used smokeless tobacco. She reports that she does not drink alcohol and does not use drugs.  REVIEW OF SYSTEMS: Review of Systems  Constitutional: Negative for chills and fever.  HENT: Negative for hoarse voice and nosebleeds.   Eyes: Negative for discharge, double vision and pain.  Cardiovascular: Negative for chest pain, claudication, dyspnea on exertion, leg swelling, near-syncope, orthopnea, palpitations, paroxysmal nocturnal dyspnea and syncope.  Respiratory: Positive for shortness of breath (chronic and stable). Negative for hemoptysis.   Musculoskeletal: Negative for muscle cramps and myalgias.  Gastrointestinal: Negative for abdominal pain, constipation, diarrhea, hematemesis, hematochezia, melena,   nausea and vomiting.  Neurological: Negative for dizziness and light-headedness.   PHYSICAL EXAM: Vitals with BMI 07/20/2019 06/08/2019 07/25/2016  Height 5' 5" 5' 5" 5' 5"  Weight 148 lbs 146 lbs 6 oz 146 lbs  BMI 24.63 77.82 42.3  Systolic 536 144 315    Diastolic 60 62 78  Pulse 50 70 69   CONSTITUTIONAL: Well-developed and well-nourished. No acute distress.  SKIN: Skin is warm and dry. No rash noted. No cyanosis. No pallor. No jaundice HEAD: Normocephalic and atraumatic.  EYES: No scleral icterus MOUTH/THROAT: Moist oral membranes.  NECK: No JVD present. No thyromegaly noted. No carotid bruits  LYMPHATIC: No visible cervical adenopathy.  CHEST Normal respiratory effort. No intercostal retractions  LUNGS: Clear to auscultation bilaterally.  No stridor. No wheezes. No rales.  CARDIOVASCULAR: Regular rate and rhythm, positive S1-S2, no murmurs rubs or gallops appreciated ABDOMINAL: Soft, nontender, nondistended, positive bowel sounds in all 4 quadrants, no apparent ascites.  EXTREMITIES: No peripheral edema  HEMATOLOGIC: No significant bruising PSYCHIATRIC: Normal mood and affect. Normal behavior. Cooperative  CARDIAC DATABASE: EKG: 06/08/2019: Normal sinus rhythm, 77 bpm, left axis deviation, right bundle branch block, left anterior fascicular block, left atrial enlargement, ST-T changes secondary to RBBB.  Echocardiogram: 06/25/2019:  Normal LV systolic function with EF 61%. Left ventricle cavity is small.  Normal global wall motion. Doppler evidence of grade I (impaired) diastolic dysfunction, normal LAP. No significant valvular abnormalities.  Stress Testing: Lexiscan Tetrofosmin stress test 06/28/2019:  Lexiscan nuclear stress test performed using 1-day protocol.  Normal myocardial perfusion. Stress LVEF 51%.  Low risk study.  Heart Catheterization: None  LABORATORY DATA:  External Labs: [facility] Collected: 05/13/2019 Creatinine 1.36 mg/dL. eGFR: 45 mL/min per 1.73 m Lipid profile: Total cholesterol 174, triglycerides 72, HDL 60, LDL 100, non-HDL 114  IMPRESSION:    ICD-10-CM   1. Encounter to discuss test results  Z71.2   2. RBBB (right bundle branch block with left anterior fascicular  block)  I45.2   3. Mixed hyperlipidemia  E78.2   4. Shortness of breath  R06.02   5. Chronic obstructive pulmonary disease, unspecified COPD type ([place])  J44.9   6. Former smoker  Z87.891      RECOMMENDATIONS: ZUMA HUST is a 74 y.o. female whose past medical history and cardiac risk factors include: Former smoker, COPD, right bundle branch block, left anterior fascicular block, mixed hyperlipidemia, postmenopausal female, advanced age.  Shortness of breath: Chronic and stable  Reviewed the results of the echocardiogram and the nuclear stress test with the patient and her sister at today's office visit.    His symptoms of shortness of breath remained stable and probably most likely secondary to her underlying COPD.    No additional cardiac work-up needed at this time as the patient remains stable.    Mixed hyperlipidemia: Continue statin therapy.  Most recent lipid profile reviewed.  Currently managed by primary team.  Former smoker: Educated on the importance of continued smoking cessation.  FINAL MEDICATION LIST END OF ENCOUNTER: No orders of the defined types were placed in this encounter.   There are no discontinued medications.   Current Outpatient Medications:  .  albuterol (VENTOLIN HFA) 108 (90 Base) MCG/ACT inhaler, Inhale into the lungs., Disp: , Rfl:  .  alendronate (FOSAMAX) 70 MG tablet, Take 70 mg by mouth once a week. Take with a full glass of water on an empty stomach., Disp: , Rfl:  .  aspirin EC 81  MG tablet, Take 81 mg by mouth daily., Disp: , Rfl:  .  atorvastatin (LIPITOR) 10 MG tablet, Take 10 mg by mouth daily., Disp: , Rfl:  .  Cyanocobalamin (VITAMIN B-12) 1000 MCG SUBL, Place under the tongue every 30 (thirty) days., Disp: , Rfl:  .  Memantine HCl-Donepezil HCl (NAMZARIC) 28-10 MG CP24, Take 1 capsule by mouth daily., Disp: , Rfl:  .  Multiple Vitamin (MULTIVITAMIN ADULT PO), Take 1 tablet by mouth daily., Disp: , Rfl:   No orders of the defined  types were placed in this encounter.  --Continue cardiac medications as reconciled in final medication list. --Return in about 6 months (around 01/19/2020) for re-evaluation of symptoms of shortness of breath.. Or sooner if needed. --Continue follow-up with your primary care physician regarding the management of your other chronic comorbid conditions.  Patient's questions and concerns were addressed to her satisfaction. She voices understanding of the instructions provided during this encounter.   This note was created using a voice recognition software as a result there may be grammatical errors inadvertently enclosed that do not reflect the nature of this encounter. Every attempt is made to correct such errors.   , DO, FACC  Pager: [phone] Office: [phone]   

## 2019-09-28 ENCOUNTER — Encounter (HOSPITAL_COMMUNITY): Payer: Self-pay

## 2019-09-28 ENCOUNTER — Emergency Department (HOSPITAL_COMMUNITY)
Admission: EM | Admit: 2019-09-28 | Discharge: 2019-09-28 | Disposition: A | Discharge status: Home / Self Care | Payer: Medicare Other | Attending: Emergency Medicine | Admitting: Emergency Medicine

## 2019-09-28 ENCOUNTER — Other Ambulatory Visit: Payer: Self-pay

## 2019-09-28 ENCOUNTER — Emergency Department (HOSPITAL_COMMUNITY): Payer: Medicare Other

## 2019-09-28 DIAGNOSIS — Z7951 Long term (current) use of inhaled steroids: Secondary | ICD-10-CM | POA: Insufficient documentation

## 2019-09-28 DIAGNOSIS — U071 COVID-19: Secondary | ICD-10-CM

## 2019-09-28 DIAGNOSIS — E785 Hyperlipidemia, unspecified: Secondary | ICD-10-CM | POA: Insufficient documentation

## 2019-09-28 DIAGNOSIS — R319 Hematuria, unspecified: Secondary | ICD-10-CM | POA: Diagnosis not present

## 2019-09-28 DIAGNOSIS — N390 Urinary tract infection, site not specified: Secondary | ICD-10-CM | POA: Diagnosis not present

## 2019-09-28 DIAGNOSIS — Z7982 Long term (current) use of aspirin: Secondary | ICD-10-CM | POA: Diagnosis not present

## 2019-09-28 LAB — URINALYSIS, ROUTINE W REFLEX MICROSCOPIC
Bilirubin Urine: NEGATIVE
Glucose, UA: NEGATIVE mg/dL
Ketones, ur: NEGATIVE mg/dL
Nitrite: NEGATIVE
Protein, ur: NEGATIVE mg/dL
Specific Gravity, Urine: 1.023 (ref 1.005–1.030)
pH: 5 (ref 5.0–8.0)

## 2019-09-28 LAB — COMPREHENSIVE METABOLIC PANEL
ALT: 17 U/L (ref 0–44)
AST: 25 U/L (ref 15–41)
Albumin: 3.7 g/dL (ref 3.5–5.0)
Alkaline Phosphatase: 70 U/L (ref 38–126)
Anion gap: 14 (ref 5–15)
BUN: 23 mg/dL (ref 8–23)
CO2: 19 mmol/L — ABNORMAL LOW (ref 22–32)
Calcium: 9 mg/dL (ref 8.9–10.3)
Chloride: 109 mmol/L (ref 98–111)
Creatinine, Ser: 1.7 mg/dL — ABNORMAL HIGH (ref 0.44–1.00)
GFR calc Af Amer: 34 mL/min — ABNORMAL LOW (ref >60)
GFR calc non Af Amer: 29 mL/min — ABNORMAL LOW (ref >60)
Glucose, Bld: 94 mg/dL (ref 70–99)
Potassium: 3.9 mmol/L (ref 3.5–5.1)
Sodium: 142 mmol/L (ref 135–145)
Total Bilirubin: 0.9 mg/dL (ref 0.3–1.2)
Total Protein: 7.6 g/dL (ref 6.5–8.1)

## 2019-09-28 LAB — CBC WITH DIFFERENTIAL/PLATELET
Abs Immature Granulocytes: 0.03 10*3/uL (ref 0.00–0.07)
Basophils Absolute: 0 10*3/uL (ref 0.0–0.1)
Basophils Relative: 1 %
Eosinophils Absolute: 0.1 10*3/uL (ref 0.0–0.5)
Eosinophils Relative: 2 %
HCT: 48.2 % — ABNORMAL HIGH (ref 36.0–46.0)
Hemoglobin: 14.6 g/dL (ref 12.0–15.0)
Immature Granulocytes: 0 %
Lymphocytes Relative: 36 %
Lymphs Abs: 2.6 10*3/uL (ref 0.7–4.0)
MCH: 30.3 pg (ref 26.0–34.0)
MCHC: 30.3 g/dL (ref 30.0–36.0)
MCV: 100 fL (ref 80.0–100.0)
Monocytes Absolute: 1 10*3/uL (ref 0.1–1.0)
Monocytes Relative: 14 %
Neutro Abs: 3.4 10*3/uL (ref 1.7–7.7)
Neutrophils Relative %: 47 %
Platelets: 205 10*3/uL (ref 150–400)
RBC: 4.82 MIL/uL (ref 3.87–5.11)
RDW: 12.2 % (ref 11.5–15.5)
WBC: 7.3 10*3/uL (ref 4.0–10.5)
nRBC: 0 % (ref 0.0–0.2)

## 2019-09-28 LAB — SARS CORONAVIRUS 2 BY RT PCR (HOSPITAL ORDER, PERFORMED IN ~~LOC~~ HOSPITAL LAB): SARS Coronavirus 2: POSITIVE — AB

## 2019-09-28 MED ORDER — CEPHALEXIN 250 MG PO CAPS
500.0000 mg | ORAL_CAPSULE | Freq: Once | ORAL | Status: AC
  Administered 2019-09-28: 500 mg via ORAL
  Filled 2019-09-28: qty 2

## 2019-09-28 MED ORDER — CEPHALEXIN 500 MG PO CAPS: 500.0000 mg | ORAL_CAPSULE | Freq: Four times a day (QID) | ORAL | 0 refills | Status: AC

## 2019-09-28 NOTE — ED Provider Notes (Signed)
[facility] EMERGENCY DEPARTMENT Provider Note   CSN: [id number] Arrival date & time: 09/28/19  1427     History Chief Complaint  Patient presents with  . Altered Mental Status    Colleen David is a 74 y.o. female.  The history is provided by a relative. No language interpreter was used.  Altered Mental Status Presenting symptoms: behavior changes   Severity:  Moderate Most recent episode:  Today Episode history:  Multiple Timing:  Constant Progression:  Worsening Chronicity:  New Context: not recent change in medication   Associated symptoms: no abdominal pain   Pt's family reports pt has not been herself.  Pt has had urinary incontinent 2-3 times.  Pt has dementia but normally is continent.       Past Medical History:  Diagnosis Date  . Dementia ([place])   . Hyperlipidemia   . Low vitamin B12 level   . Nicotine dependence   . Restrictive airway disease     Patient Active Problem List   Diagnosis Date Noted  . B12 deficiency 09/30/2016  . Abnormality of right breast on screening mammogram 07/10/2016  . Low bone mass 04/14/2015  . Mixed hyperlipidemia 03/08/2015  . Advanced care planning/counseling discussion 03/01/2015  . Mild cognitive impairment with memory loss 03/01/2015  . Simple chronic bronchitis ([place]) 03/01/2015  . Weight loss, unintentional 03/01/2015  . TOBACCO ABUSE 09/22/2009  . COPD 09/22/2009  . SYNCOPE 09/22/2009  . Nicotine dependence, cigarettes, uncomplicated 96/22/2979    Past Surgical History:  Procedure Laterality Date  . ROTATOR CUFF REPAIR       OB History   No obstetric history on file.     Family History  Problem Relation Age of Onset  . Lung cancer Mother   . Diabetes Mother   . Hypertension Mother   . Colon cancer Mother   . Alcohol abuse Father   . Hypertension Sister   . Hypertension Sister     Social History   Tobacco Use  . Smoking status: Former Smoker    Packs/day: 0.50    Years:  55.00    Pack years: 27.50    Types: Cigarettes    Quit date: 2020    Years since quitting: 1.6  . Smokeless tobacco: Never Used  Vaping Use  . Vaping Use: Never used  Substance Use Topics  . Alcohol use: No  . Drug use: No    Home Medications Prior to Admission medications   Medication Sig Start Date End Date Taking? Authorizing Provider  albuterol (VENTOLIN HFA) 108 (90 Base) MCG/ACT inhaler Inhale into the lungs. 11/10/17  Yes [provider]  alendronate (FOSAMAX) 70 MG tablet Take 70 mg by mouth every Sunday. Take with a full glass of water on an empty stomach.    Yes [provider]  aspirin EC 81 MG tablet Take 81 mg by mouth at bedtime.    Yes [provider]  atorvastatin (LIPITOR) 10 MG tablet Take 10 mg by mouth at bedtime.    Yes [provider]  Memantine HCl-Donepezil HCl (NAMZARIC) 28-10 MG CP24 Take 1 capsule by mouth at bedtime.    Yes [provider]  Multiple Vitamin (MULTIVITAMIN ADULT PO) Take 1 tablet by mouth at bedtime.    Yes [provider]    Allergies    Patient has no known allergies.  Review of Systems   Review of Systems  Gastrointestinal: Negative for abdominal pain.  All other systems reviewed and are  negative.   Physical Exam Updated Vital Signs BP (!) 122/59   Pulse 70   Temp 99.2 F (37.3 C) (Oral)   Resp 19   Ht 5\' 6"  (1.676 m)   Wt 68.5 kg   SpO2 98%   BMI 24.37 kg/m   Physical Exam Vitals and nursing note reviewed.  Constitutional:      Appearance: She is well-developed. She is not toxic-appearing.  HENT:     Head: Normocephalic.     Mouth/Throat:     Mouth: Mucous membranes are moist.  Eyes:     Pupils: Pupils are equal, round, and reactive to light.  Cardiovascular:     Rate and Rhythm: Normal rate.  Pulmonary:     Effort: Pulmonary effort is normal.  Abdominal:     General: Abdomen is flat. There is no distension.  Musculoskeletal:        General: Normal range  of motion.     Cervical back: Normal range of motion.  Skin:    General: Skin is warm.  Neurological:     Mental Status: She is alert and oriented to person, place, and time.  Psychiatric:        Mood and Affect: Mood normal.     ED Results / Procedures / Treatments   Labs (all labs ordered are listed, but only abnormal results are displayed) Labs Reviewed  SARS CORONAVIRUS 2 BY RT PCR (HOSPITAL ORDER, [facility]) - Abnormal; Notable for the following components:      Result Value   SARS Coronavirus 2 POSITIVE (*)    All other components within normal limits  URINALYSIS, ROUTINE W REFLEX MICROSCOPIC - Abnormal; Notable for the following components:   Color, Urine AMBER (*)    APPearance CLOUDY (*)    Hgb urine dipstick SMALL (*)    Leukocytes,Ua TRACE (*)    Bacteria, UA FEW (*)    All other components within normal limits  CBC WITH DIFFERENTIAL/PLATELET - Abnormal; Notable for the following components:   HCT 48.2 (*)    All other components within normal limits  COMPREHENSIVE METABOLIC PANEL - Abnormal; Notable for the following components:   CO2 19 (*)    Creatinine, Ser 1.70 (*)    GFR calc non Af Amer 29 (*)    GFR calc Af Amer 34 (*)    All other components within normal limits    EKG EKG Interpretation  Date/Time:  Tuesday September 28 2019 17:33:38 EDT Ventricular Rate:  72 PR Interval:    QRS Duration: 126 QT Interval:  464 QTC Calculation: 508 R Axis:   -84 Text Interpretation: Sinus rhythm RBBB and LAFB Non-specific ST-t changes Since last tracing T wave inversion NOW PRESENT Confirmed by Calvert Cantor [phone]) on 09/28/2019 5:40:19 PM   Radiology DG Chest Port 1 View  Result Date: 09/28/2019 CLINICAL DATA:  Confusion. EXAM: PORTABLE CHEST 1 VIEW COMPARISON:  08/24/2009 FINDINGS: The cardiomediastinal silhouette is unchanged with normal heart size. Aortic atherosclerosis is noted. The lungs are well inflated with mild chronic  interstitial coarsening. No confluent airspace opacity, edema, sizable pleural effusion, or pneumothorax is identified. No acute osseous abnormality is seen. IMPRESSION: No active disease. Electronically Signed   By: Logan Bores M.D.   On: 09/28/2019 16:03    Procedures Procedures (including critical care time)  Medications Ordered in ED Medications - No data to display  ED Course  I have reviewed the triage vital signs and the nursing notes.  Pertinent labs & imaging results that were available during my care of the patient were reviewed by me and considered in my medical decision making (see chart for details).    MDM Rules/Calculators/A&P                          MDM:  Pt is covid positive.  She has been immunized.  Urine shows 11-20 wbc's.  I suspect uti is the cause of her incontinence.  Family counseled on results.  Urine culture ordered.  Final Clinical Impression(s) / ED Diagnoses Final diagnoses:  Urinary tract infection with hematuria, site unspecified  COVID-19    Rx / DC Orders ED Discharge Orders         Ordered    cephALEXin (KEFLEX) 500 MG capsule  4 times daily        09/28/19 2128        An After Visit Summary was printed and given to the patient.    Sidney Ace 09/28/19 2128    Truddie Hidden, MD 09/28/19 2158

## 2019-09-28 NOTE — ED Triage Notes (Signed)
Pt BIB [facility] from urgent care. Pt has a hx of dementia and is only A&Ox2 at baseline. Per pt's sister pt has not been acting herself. Pt normally uses the restroom but has been using the restroom on herself since Sunday. Sister also states she has had some tremors. Per sister UC thought maybe UTI. Pt denies pain and states she doesn't have any complaints herself. Pt was initially hypotensive at [facility] 92/58. Pt received 285ml bolus before pulling out IVs.

## 2019-09-28 NOTE — Discharge Instructions (Addendum)
Return if any problems.

## 2019-09-30 LAB — URINE CULTURE: Culture: >=100000 — AB

## 2019-11-07 DIAGNOSIS — R7989 Other specified abnormal findings of blood chemistry: Secondary | ICD-10-CM | POA: Insufficient documentation

## 2019-11-07 DIAGNOSIS — F0150 Vascular dementia without behavioral disturbance: Secondary | ICD-10-CM | POA: Insufficient documentation

## 2019-12-18 ENCOUNTER — Ambulatory Visit: Payer: Medicare Other | Attending: Internal Medicine

## 2019-12-18 DIAGNOSIS — Z23 Encounter for immunization: Secondary | ICD-10-CM

## 2019-12-18 NOTE — Progress Notes (Signed)
   Covid-19 Vaccination Clinic  Name:  ADLYNN LOWENSTEIN    MRN: [id number] DOB: April 26, 1945  12/18/2019  Ms. Chou was observed post Covid-19 immunization for 15 minutes without incident. She was provided with Vaccine Information Sheet and instruction to access the V-Safe system.   Ms. Silvey was instructed to call 911 with any severe reactions post vaccine: Marland Kitchen Difficulty breathing  . Swelling of face and throat  . A fast heartbeat  . A bad rash all over body  . Dizziness and weakness   Immunizations Administered    Name Date Dose VIS Date Route   Pfizer COVID-19 Vaccine 12/18/2019 12:26 PM 0.3 mL 11/24/2019 Intramuscular   Manufacturer: [place]   Lot: [id number]   [place]: [id number]

## 2020-01-19 ENCOUNTER — Ambulatory Visit: Payer: Medicare Other | Admitting: Cardiology

## 2020-03-17 ENCOUNTER — Ambulatory Visit (INDEPENDENT_AMBULATORY_CARE_PROVIDER_SITE_OTHER): Payer: Medicare Other | Admitting: Podiatry

## 2020-03-17 ENCOUNTER — Other Ambulatory Visit: Payer: Self-pay

## 2020-03-17 DIAGNOSIS — M79609 Pain in unspecified limb: Secondary | ICD-10-CM

## 2020-03-17 DIAGNOSIS — B351 Tinea unguium: Secondary | ICD-10-CM | POA: Diagnosis not present

## 2020-04-03 NOTE — Progress Notes (Signed)
  Subjective:  Patient ID: Colleen David, female    DOB: [birth year],  MRN: [id number]  Chief Complaint  Patient presents with  . Nail Problem    Nail trim 1-5 bilateral    75 y.o. female presents with the above complaint. History confirmed with patient.  States the nails are causing her pain  Objective:  Physical Exam: warm, good capillary refill, nail exam onychomycosis of the toenails and pain to palpation of nailbeds, no trophic changes or ulcerative lesions, normal DP and PT pulses and normal sensory exam. Left Foot: normal exam, no swelling, tenderness, instability; ligaments intact, full range of motion of all ankle/foot joints  Right Foot: normal exam, no swelling, tenderness, instability; ligaments intact, full range of motion of all ankle/foot joints  Assessment:   1. Pain due to onychomycosis of nail    Plan:  Patient was evaluated and treated and all questions answered.  Onychomycosis -Nails palliatively debrided secondary to pain  Procedure: Nail Debridement Type of Debridement: manual, sharp debridement. Instrumentation: Nail nipper, rotary burr. Number of Nails: 10    No follow-ups on file.

## 2020-06-16 ENCOUNTER — Ambulatory Visit (INDEPENDENT_AMBULATORY_CARE_PROVIDER_SITE_OTHER): Payer: Medicare Other | Admitting: Podiatry

## 2020-06-16 ENCOUNTER — Other Ambulatory Visit: Payer: Self-pay

## 2020-06-16 DIAGNOSIS — B351 Tinea unguium: Secondary | ICD-10-CM | POA: Diagnosis not present

## 2020-06-16 DIAGNOSIS — M79676 Pain in unspecified toe(s): Secondary | ICD-10-CM | POA: Diagnosis not present

## 2020-06-16 NOTE — Progress Notes (Signed)
  Subjective:  Patient ID: Colleen David, female    DOB: 28-Jul-1945,  MRN: [id number]  Chief Complaint  Patient presents with  . Nail Problem    Nail trim 1-5 bilateral    75 y.o. female presents with the above complaint. History confirmed with patient.  Nails are causing pain.  Objective:  Physical Exam: warm, good capillary refill, nail exam onychomycosis of the toenails and pain to palpation of nailbeds, no trophic changes or ulcerative lesions, normal DP and PT pulses and normal sensory exam. Left Foot: normal exam, no swelling, tenderness, instability; ligaments intact, full range of motion of all ankle/foot joints  Right Foot: normal exam, no swelling, tenderness, instability; ligaments intact, full range of motion of all ankle/foot joints  Assessment:   1. Pain due to onychomycosis of nail    Plan:  Patient was evaluated and treated and all questions answered.  Onychomycosis -Nails debrided 2/2 pain  Procedure: Nail Debridement Type of Debridement: manual, sharp debridement. Instrumentation: Nail nipper, rotary burr. Number of Nails: 10  No follow-ups on file.

## 2020-09-22 ENCOUNTER — Ambulatory Visit (INDEPENDENT_AMBULATORY_CARE_PROVIDER_SITE_OTHER): Payer: Medicare Other | Admitting: Podiatry

## 2020-09-22 ENCOUNTER — Other Ambulatory Visit: Payer: Self-pay

## 2020-09-22 DIAGNOSIS — M79609 Pain in unspecified limb: Secondary | ICD-10-CM | POA: Diagnosis not present

## 2020-09-22 DIAGNOSIS — B351 Tinea unguium: Secondary | ICD-10-CM

## 2020-09-22 NOTE — Progress Notes (Signed)
  Subjective:  Patient ID: Colleen David, female    DOB: [birth year],  MRN: [id number]  Chief Complaint  Patient presents with   Nail Problem    Nail trim 1-5 bilateral    75 y.o. female presents with the above complaint. History confirmed with patient.  Nails are causing pain and the big toenails are ingrowing.  Objective:  Physical Exam: warm, good capillary refill, nail exam onychomycosis of the toenails and pain to palpation of nailbeds, no trophic changes or ulcerative lesions, normal DP and PT pulses and normal sensory exam. Left Foot: normal exam, no swelling, tenderness, instability; ligaments intact, full range of motion of all ankle/foot joints  Right Foot: normal exam, no swelling, tenderness, instability; ligaments intact, full range of motion of all ankle/foot joints  Assessment:   1. Pain due to onychomycosis of nail    Plan:  Patient was evaluated and treated and all questions answered.  Onychomycosis -Nails debrided 2/2 pain, ingrowing nails.  Procedure: Nail Debridement Type of Debridement: manual, sharp debridement. Instrumentation: Nail nipper, rotary burr. Number of Nails: 10    Return if symptoms worsen or fail to improve.

## 2020-10-31 ENCOUNTER — Encounter: Payer: Self-pay | Admitting: Podiatry

## 2020-12-14 ENCOUNTER — Other Ambulatory Visit: Payer: Self-pay

## 2020-12-14 ENCOUNTER — Encounter (HOSPITAL_COMMUNITY): Payer: Self-pay | Admitting: Emergency Medicine

## 2020-12-14 ENCOUNTER — Emergency Department (HOSPITAL_COMMUNITY): Payer: Medicare Other

## 2020-12-14 ENCOUNTER — Observation Stay (HOSPITAL_COMMUNITY)
Admission: EM | Admit: 2020-12-14 | Discharge: 2020-12-15 | Disposition: A | Discharge status: Home / Self Care | Payer: Medicare Other | Attending: Internal Medicine | Admitting: Internal Medicine

## 2020-12-14 DIAGNOSIS — R509 Fever, unspecified: Secondary | ICD-10-CM | POA: Insufficient documentation

## 2020-12-14 DIAGNOSIS — N1832 Chronic kidney disease, stage 3b: Secondary | ICD-10-CM | POA: Insufficient documentation

## 2020-12-14 DIAGNOSIS — J449 Chronic obstructive pulmonary disease, unspecified: Secondary | ICD-10-CM | POA: Insufficient documentation

## 2020-12-14 DIAGNOSIS — E785 Hyperlipidemia, unspecified: Secondary | ICD-10-CM | POA: Diagnosis not present

## 2020-12-14 DIAGNOSIS — R4182 Altered mental status, unspecified: Principal | ICD-10-CM | POA: Insufficient documentation

## 2020-12-14 DIAGNOSIS — I6782 Cerebral ischemia: Secondary | ICD-10-CM | POA: Insufficient documentation

## 2020-12-14 DIAGNOSIS — N183 Chronic kidney disease, stage 3 unspecified: Secondary | ICD-10-CM | POA: Diagnosis present

## 2020-12-14 DIAGNOSIS — Z87891 Personal history of nicotine dependence: Secondary | ICD-10-CM | POA: Diagnosis not present

## 2020-12-14 DIAGNOSIS — E782 Mixed hyperlipidemia: Secondary | ICD-10-CM | POA: Diagnosis present

## 2020-12-14 DIAGNOSIS — Z20822 Contact with and (suspected) exposure to covid-19: Secondary | ICD-10-CM | POA: Diagnosis not present

## 2020-12-14 DIAGNOSIS — F0150 Vascular dementia without behavioral disturbance: Secondary | ICD-10-CM | POA: Insufficient documentation

## 2020-12-14 DIAGNOSIS — Z79899 Other long term (current) drug therapy: Secondary | ICD-10-CM | POA: Diagnosis not present

## 2020-12-14 DIAGNOSIS — Z7982 Long term (current) use of aspirin: Secondary | ICD-10-CM | POA: Insufficient documentation

## 2020-12-14 DIAGNOSIS — E538 Deficiency of other specified B group vitamins: Secondary | ICD-10-CM | POA: Insufficient documentation

## 2020-12-14 LAB — COMPREHENSIVE METABOLIC PANEL
ALT: 12 U/L (ref 0–44)
AST: 20 U/L (ref 15–41)
Albumin: 3.9 g/dL (ref 3.5–5.0)
Alkaline Phosphatase: 78 U/L (ref 38–126)
Anion gap: 8 (ref 5–15)
BUN: 23 mg/dL (ref 8–23)
CO2: 25 mmol/L (ref 22–32)
Calcium: 9 mg/dL (ref 8.9–10.3)
Chloride: 108 mmol/L (ref 98–111)
Creatinine, Ser: 1.49 mg/dL — ABNORMAL HIGH (ref 0.44–1.00)
GFR, Estimated: 37 mL/min — ABNORMAL LOW (ref >60)
Glucose, Bld: 107 mg/dL — ABNORMAL HIGH (ref 70–99)
Potassium: 3.6 mmol/L (ref 3.5–5.1)
Sodium: 141 mmol/L (ref 135–145)
Total Bilirubin: 1.7 mg/dL — ABNORMAL HIGH (ref 0.3–1.2)
Total Protein: 8 g/dL (ref 6.5–8.1)

## 2020-12-14 LAB — LACTIC ACID, PLASMA
Lactic Acid, Venous: 1.4 mmol/L (ref 0.5–1.9)
Lactic Acid, Venous: 2.2 mmol/L (ref 0.5–1.9)

## 2020-12-14 LAB — PROTIME-INR
INR: 1.1 (ref 0.8–1.2)
Prothrombin Time: 13.9 seconds (ref 11.4–15.2)

## 2020-12-14 LAB — CBC WITH DIFFERENTIAL/PLATELET
Abs Immature Granulocytes: 0.03 10*3/uL (ref 0.00–0.07)
Basophils Absolute: 0.1 10*3/uL (ref 0.0–0.1)
Basophils Relative: 0 %
Eosinophils Absolute: 0.1 10*3/uL (ref 0.0–0.5)
Eosinophils Relative: 0 %
HCT: 41.6 % (ref 36.0–46.0)
Hemoglobin: 13.5 g/dL (ref 12.0–15.0)
Immature Granulocytes: 0 %
Lymphocytes Relative: 24 %
Lymphs Abs: 2.7 10*3/uL (ref 0.7–4.0)
MCH: 30.3 pg (ref 26.0–34.0)
MCHC: 32.5 g/dL (ref 30.0–36.0)
MCV: 93.5 fL (ref 80.0–100.0)
Monocytes Absolute: 0.8 10*3/uL (ref 0.1–1.0)
Monocytes Relative: 7 %
Neutro Abs: 7.5 10*3/uL (ref 1.7–7.7)
Neutrophils Relative %: 69 %
Platelets: 252 10*3/uL (ref 150–400)
RBC: 4.45 MIL/uL (ref 3.87–5.11)
RDW: 11.9 % (ref 11.5–15.5)
WBC: 11.2 10*3/uL — ABNORMAL HIGH (ref 4.0–10.5)
nRBC: 0 % (ref 0.0–0.2)

## 2020-12-14 LAB — URINALYSIS, ROUTINE W REFLEX MICROSCOPIC
Bacteria, UA: NONE SEEN
Bilirubin Urine: NEGATIVE
Glucose, UA: NEGATIVE mg/dL
Ketones, ur: NEGATIVE mg/dL
Leukocytes,Ua: NEGATIVE
Nitrite: NEGATIVE
Protein, ur: NEGATIVE mg/dL
Specific Gravity, Urine: 1.018 (ref 1.005–1.030)
pH: 5 (ref 5.0–8.0)

## 2020-12-14 LAB — RESP PANEL BY RT-PCR (FLU A&B, COVID) ARPGX2
Influenza A by PCR: NEGATIVE
Influenza B by PCR: NEGATIVE
SARS Coronavirus 2 by RT PCR: NEGATIVE

## 2020-12-14 LAB — APTT: aPTT: 37 seconds — ABNORMAL HIGH (ref 24–36)

## 2020-12-14 MED ORDER — ENOXAPARIN SODIUM 30 MG/0.3ML IJ SOSY: 30.0000 mg | PREFILLED_SYRINGE | INTRAMUSCULAR | Status: DC

## 2020-12-14 MED ORDER — ACETAMINOPHEN 650 MG RE SUPP: 650.0000 mg | Freq: Four times a day (QID) | RECTAL | Status: DC | PRN

## 2020-12-14 MED ORDER — ONDANSETRON HCL 4 MG/2ML IJ SOLN: 4.0000 mg | Freq: Four times a day (QID) | INTRAMUSCULAR | Status: DC | PRN

## 2020-12-14 MED ORDER — SODIUM CHLORIDE 0.9 % IV SOLN: INTRAVENOUS | Status: AC

## 2020-12-14 MED ORDER — ACETAMINOPHEN 325 MG PO TABS: 650.0000 mg | ORAL_TABLET | Freq: Four times a day (QID) | ORAL | Status: DC | PRN

## 2020-12-14 MED ORDER — ONDANSETRON HCL 4 MG PO TABS: 4.0000 mg | ORAL_TABLET | Freq: Four times a day (QID) | ORAL | Status: DC | PRN

## 2020-12-14 MED ORDER — MEMANTINE HCL-DONEPEZIL HCL ER 28-10 MG PO CP24: 1.0000 | ORAL_CAPSULE | Freq: Every day | ORAL | Status: DC

## 2020-12-14 MED ORDER — ATORVASTATIN CALCIUM 10 MG PO TABS: 10.0000 mg | ORAL_TABLET | Freq: Every day | ORAL | Status: DC

## 2020-12-14 MED ORDER — ACETAMINOPHEN 650 MG RE SUPP
650.0000 mg | Freq: Once | RECTAL | Status: AC
  Administered 2020-12-14: 650 mg via RECTAL
  Filled 2020-12-14: qty 1

## 2020-12-14 MED ORDER — SENNOSIDES-DOCUSATE SODIUM 8.6-50 MG PO TABS: 1.0000 | ORAL_TABLET | Freq: Every evening | ORAL | Status: DC | PRN

## 2020-12-14 MED ORDER — ACETAMINOPHEN 325 MG PO TABS
650.0000 mg | ORAL_TABLET | Freq: Once | ORAL | Status: DC
  Filled 2020-12-14: qty 2

## 2020-12-14 MED ORDER — ALBUTEROL SULFATE (2.5 MG/3ML) 0.083% IN NEBU: 2.5000 mg | INHALATION_SOLUTION | Freq: Four times a day (QID) | RESPIRATORY_TRACT | Status: DC | PRN

## 2020-12-14 NOTE — H&P (Signed)
History and Physical    Colleen David IBB:[id number] DOB: 1945-04-24 DOA: 12/14/2020  PCP: Berkley Harvey, NP  Patient coming from: Home  I have personally briefly reviewed patient's old medical records in [place]  Chief Complaint: Altered mental status  HPI: Colleen David is a 75 y.o. female with medical history significant for vascular dementia, COPD, hyperlipidemia, CKD stage IIIb, vitamin B12 deficiency who presented to the ED from home for evaluation of change in mental status.  History is limited from patient due to altered mental status/dementia and is otherwise obtained by EDP, chart review, and son at bedside.  Per son patient currently lives at home with her son.  Son states that at baseline patient is ambulatory and converses fairly well but does have short-term memory issues.  She is able to feed herself.  Over the last few days he has noticed that she has been grabbing at her right flank area, possibly due to pain.  She had a couple episodes of incontinence in her briefs.  Over the last day she has had worsening mental status with significant fatigue/lethargy and decreased communication.  She has been staying in bed.  He says this is a significant change from her baseline.  She has not had any recent changes in her medications.  ED Course:  Initial vitals showed BP 144/54, pulse 83, RR 18, temp 97.4 F, SPO2 98% on room air.  Rectal temperature 101.2 F.  Labs show WBC 11.2, hemoglobin 13.5, platelets 252,000, sodium 141, potassium 3.6, bicarb 25, BUN 23, creatinine 1.49, serum glucose 107, lactic acid 1.4 > 2.2.  Urinalysis is negative for UTI.  Blood and urine cultures collected and pending.  SARS-CoV-2 and influenza are negative.  CT head without contrast is negative for evidence of acute intracranial abnormality.  Atrophy and chronic small vessel ischemic changes of white matter noted.  Portable chest x-ray shows hyperinflated lung fields without focal  consolidation, edema, or effusion.  Patient was given rectal Tylenol and the hospitalist service was consulted to admit for further evaluation and management.  Review of Systems: All systems reviewed and are negative except as documented in history of present illness above.   Past Medical History:  Diagnosis Date   Dementia ([place])    Hyperlipidemia    Low vitamin B12 level    Nicotine dependence    Restrictive airway disease     Past Surgical History:  Procedure Laterality Date   ROTATOR CUFF REPAIR      Social History:  reports that she quit smoking about 2 years ago. Her smoking use included cigarettes. She has a 27.50 pack-year smoking history. She has never used smokeless tobacco. She reports that she does not drink alcohol and does not use drugs.  No Known Allergies  Family History  Problem Relation Age of Onset   Lung cancer Mother    Diabetes Mother    Hypertension Mother    Colon cancer Mother    Alcohol abuse Father    Hypertension Sister    Hypertension Sister      Prior to Admission medications   Medication Sig Start Date End Date Taking? Authorizing Provider  albuterol (VENTOLIN HFA) 108 (90 Base) MCG/ACT inhaler Inhale into the lungs. 11/10/17   [provider]  alendronate (FOSAMAX) 70 MG tablet Take 70 mg by mouth every Sunday. Take with a full glass of water on an empty stomach.    [provider]  aspirin EC 81 MG tablet Take 81  mg by mouth at bedtime.     [provider]  atorvastatin (LIPITOR) 10 MG tablet Take 10 mg by mouth at bedtime.     [provider]  atorvastatin (LIPITOR) 10 MG tablet Take 1 tablet by mouth daily. 09/12/20   [provider]  BINAXNOW COVID-19 AG HOME TEST KIT TEST AS DIRECTED TODAY 06/25/20   [provider]  donepezil (ARICEPT) 10 MG tablet TAKE 1 TABLET(10 MG) BY MOUTH EVERY NIGHT 07/27/20   [provider]  memantine (NAMENDA XR) 28 MG CP24 24 hr capsule TAKE 1  CAPSULE(28 MG) BY MOUTH AT BEDTIME 07/27/20   [provider]  Memantine HCl-Donepezil HCl 28-10 MG CP24 Take 1 capsule by mouth at bedtime.     [provider]  Multiple Vitamin (MULTIVITAMIN ADULT PO) Take 1 tablet by mouth at bedtime.     [provider]  Multiple Vitamins-Minerals (MULTI COMPLETE/IRON) TABS Take by mouth.    [provider]    Physical Exam: Vitals:   12/14/20 2030 12/14/20 2100 12/14/20 2130 12/14/20 2200  BP: 108/60 (!) 146/58 (!) 103/53 (!) 135/58  Pulse: 69 67 65 66  Resp: '15 14 14 14  ' Temp: 99.4 F (37.4 C)     TempSrc:      SpO2: 99% 97% 98% 98%  Weight:      Height:       Exam limited due to altered mental status. Constitutional: Resting supine in bed, somnolent but appears in NAD, calm, comfortable Eyes: PERRL, lids and conjunctivae normal ENMT: Mucous membranes are moist. Posterior pharynx clear of any exudate or lesions.Normal dentition.  Neck: normal, supple, no masses. Respiratory: clear to auscultation anteriorly. Normal respiratory effort. No accessory muscle use.  Cardiovascular: Regular rate and rhythm, no murmurs / rubs / gallops. No extremity edema. 2+ pedal pulses. Abdomen: Some grimacing with palpation right side of the abdomen.  No masses palpated. No hepatosplenomegaly. Bowel sounds positive.  Musculoskeletal: no clubbing / cyanosis. No joint deformity upper and lower extremities. Good ROM, no contractures. Normal muscle tone.  Skin: no rashes, lesions, ulcers. No induration Neurologic: CN 2-12 grossly intact. Sensation intact. Strength 5/5 in all 4.  Psychiatric: Somnolent, intermittently awakens and opens eyes.  Not really following commands or answering questions.  Labs on Admission: I have personally reviewed following labs and imaging studies  CBC: Recent Labs  Lab 12/14/20 1822  WBC 11.2*  NEUTROABS 7.5  HGB 13.5  HCT 41.6  MCV 93.5  PLT 952   Basic Metabolic Panel: Recent Labs  Lab  12/14/20 1822  NA 141  K 3.6  CL 108  CO2 25  GLUCOSE 107*  BUN 23  CREATININE 1.49*  CALCIUM 9.0   GFR: Estimated Creatinine Clearance: 31 mL/min (A) (by C-G formula based on SCr of 1.49 mg/dL (H)). Liver Function Tests: Recent Labs  Lab 12/14/20 1822  AST 20  ALT 12  ALKPHOS 78  BILITOT 1.7*  PROT 8.0  ALBUMIN 3.9   No results for input(s): LIPASE, AMYLASE in the last 168 hours. No results for input(s): AMMONIA in the last 168 hours. Coagulation Profile: Recent Labs  Lab 12/14/20 1822  INR 1.1   Cardiac Enzymes: No results for input(s): CKTOTAL, CKMB, CKMBINDEX, TROPONINI in the last 168 hours. BNP (last 3 results) No results for input(s): PROBNP in the last 8760 hours. HbA1C: No results for input(s): HGBA1C in the last 72 hours. CBG: No results for input(s): GLUCAP in the last 168 hours. Lipid Profile:  No results for input(s): CHOL, HDL, LDLCALC, TRIG, CHOLHDL, LDLDIRECT in the last 72 hours. Thyroid Function Tests: No results for input(s): TSH, T4TOTAL, FREET4, T3FREE, THYROIDAB in the last 72 hours. Anemia Panel: No results for input(s): VITAMINB12, FOLATE, FERRITIN, TIBC, IRON, RETICCTPCT in the last 72 hours. Urine analysis:    Component Value Date/Time   COLORURINE YELLOW 12/14/2020 2118   APPEARANCEUR CLEAR 12/14/2020 2118   LABSPEC 1.018 12/14/2020 2118   PHURINE 5.0 12/14/2020 2118   GLUCOSEU NEGATIVE 12/14/2020 2118   HGBUR MODERATE (A) 12/14/2020 2118   BILIRUBINUR NEGATIVE 12/14/2020 2118   [place] NEGATIVE 12/14/2020 2118   PROTEINUR NEGATIVE 12/14/2020 2118   NITRITE NEGATIVE 12/14/2020 2118   LEUKOCYTESUR NEGATIVE 12/14/2020 2118    Radiological Exams on Admission: CT Head Wo Contrast  Result Date: 12/14/2020 CLINICAL DATA:  Mental status change EXAM: CT HEAD WITHOUT CONTRAST TECHNIQUE: Contiguous axial images were obtained from the base of the skull through the vertex without intravenous contrast. COMPARISON:  MRI 08/25/2009  FINDINGS: Brain: No acute territorial infarction, hemorrhage or intracranial mass. Moderate atrophy. Fairly extensive white matter hypodensity likely chronic small vessel ischemic change, progressed compared to prior MRI. Ventricular enlargement likely due to atrophy progression Vascular: No hyperdense vessel.  Carotid vascular calcification. Skull: Normal. Negative for fracture or focal lesion. Sinuses/Orbits: No acute finding. Other: None IMPRESSION: 1. No CT evidence for acute intracranial abnormality. 2. Atrophy and chronic small vessel ischemic changes of the white matter. Electronically Signed   By: Donavan Foil M.D.   On: 12/14/2020 17:44   DG Chest Port 1 View  Result Date: 12/14/2020 CLINICAL DATA:  Confusion.  Dementia. EXAM: PORTABLE CHEST 1 VIEW COMPARISON:  09/28/2019 FINDINGS: Numerous leads and wires project over the chest. Mild hyperinflation. Midline trachea. Normal heart size. No pleural effusion or pneumothorax. Clear lungs. IMPRESSION: Hyperinflation, without acute disease. Electronically Signed   By: Abigail Miyamoto M.D.   On: 12/14/2020 19:16    EKG: Personally reviewed. Normal sinus rhythm, RBBB and LAFB.  Similar to prior.  Assessment/Plan Principal Problem:   Febrile illness Active Problems:   COPD (chronic obstructive pulmonary disease) (HCC)   Mixed hyperlipidemia   Vascular dementia without behavioral disturbance (HCC)   Altered mental status   CKD (chronic kidney disease), stage III (HCC)   Colleen BEAHM is a 75 y.o. female with medical history significant for vascular dementia, COPD, hyperlipidemia, CKD stage IIIb, vitamin B12 deficiency who is admitted with febrile illness had altered mental status.  Febrile illness with altered mental status: T-max 101.2 F on admission.  Mild leukocytosis of 11.2.  No clear infectious source.  COVID and influenza are negative.  CXR without evidence of pneumonia.  Urinalysis negative for UTI.  Is also likely dehydrated from  decreased oral intake.  Hold antibiotics for now but would have low threshold to start if having persistent fevers. -Check full respiratory viral panel -If viral panel negative, then consider abdominal imaging -Follow blood and urine cultures -Start on IV NS'@100'  mL/hour overnight  Vascular dementia: Increased somnolence from baseline in setting of febrile illness.  Continue home memantine-donepezil and delirium precautions.  CKD stage IIIb: Currently stable.  Continue to monitor.  COPD: Stable, no acute respiratory issue.  Continue albuterol as needed.  Hyperlipidemia: Continue atorvastatin.  DVT prophylaxis: Lovenox Code Status: DNR, confirmed with patient's son on admission Family Communication: Discussed with patient's son at bedside Disposition Plan: From home, dispo pending clinical progress Consults called: None Level of care: Med-Surg Admission status:  Status is: Observation  The patient remains OBS appropriate and will d/c before 2 midnights.  Zada Finders MD [facility]  If 7PM-7AM, please contact night-coverage [email or web address]  12/14/2020, 10:41 PM

## 2020-12-14 NOTE — ED Notes (Signed)
ED TO INPATIENT HANDOFF REPORT  ED Nurse Name and Phone #:   S Name/Age/Gender Colleen David 75 y.o. female Room/Bed: WA09/WA09  Code Status   Code Status: Not on file  Home/SNF/Other Home Patient oriented to: self Is this baseline?  Has dementia / son states she is more altered than normal  Triage Complete: Triage complete  Chief Complaint Febrile illness [R50.9]  Triage Note Pt to ER via EMS from urgent care.  Pt presented to urgent care with family with c/o increased confusion and increased urination.  Pt has hx of alzheimer's dementia.  Family voices no other concerns at this time.   Allergies No Known Allergies  Level of Care/Admitting Diagnosis ED Disposition     ED Disposition  Admit   Condition  --   Comment  Hospital Area: [facility] [[id number]]  Level of Care: Med-Surg [16]  May place patient in observation at [facility] or [facility] if equivalent level of care is available:: No  Covid Evaluation: Confirmed COVID Negative  Diagnosis: Febrile illness [417408]  Admitting Physician: Lenore Cordia [[id number]]  Attending Physician: Lenore Cordia [[id number]]          B Medical/Surgery History Past Medical History:  Diagnosis Date   Dementia ([place])    Hyperlipidemia    Low vitamin B12 level    Nicotine dependence    Restrictive airway disease    Past Surgical History:  Procedure Laterality Date   ROTATOR CUFF REPAIR       A IV Location/Drains/Wounds Patient Lines/Drains/Airways Status     Active Line/Drains/Airways     Name Placement date Placement time Site Days   Peripheral IV 12/14/20 20 G Right Antecubital 12/14/20  1740  Antecubital  less than 1            Intake/Output Last 24 hours No intake or output data in the 24 hours ending 12/14/20 2216  Labs/Imaging Results for orders placed or performed during the hospital encounter of 12/14/20 (from the past 48 hour(s))  Comprehensive metabolic panel      Status: Abnormal   Collection Time: 12/14/20  6:22 PM  Result Value Ref Range   Sodium 141 135 - 145 mmol/L   Potassium 3.6 3.5 - 5.1 mmol/L   Chloride 108 98 - 111 mmol/L   CO2 25 22 - 32 mmol/L   Glucose, Bld 107 (H) 70 - 99 mg/dL    Comment: Glucose reference range applies only to samples taken after fasting for at least 8 hours.   BUN 23 8 - 23 mg/dL   Creatinine, Ser 1.49 (H) 0.44 - 1.00 mg/dL   Calcium 9.0 8.9 - 10.3 mg/dL   Total Protein 8.0 6.5 - 8.1 g/dL   Albumin 3.9 3.5 - 5.0 g/dL   AST 20 15 - 41 U/L   ALT 12 0 - 44 U/L   Alkaline Phosphatase 78 38 - 126 U/L   Total Bilirubin 1.7 (H) 0.3 - 1.2 mg/dL   GFR, Estimated 37 (L) >60 mL/min    Comment: (NOTE) Calculated using the CKD-EPI Creatinine Equation (2021)    Anion gap 8 5 - 15    Comment: Performed at [facility], [place] [address], [place], [place] [address]  CBC with Differential     Status: Abnormal   Collection Time: 12/14/20  6:22 PM  Result Value Ref Range   WBC 11.2 (H) 4.0 - 10.5 K/uL   RBC 4.45 3.87 - 5.11 MIL/uL  Hemoglobin 13.5 12.0 - 15.0 g/dL   HCT 41.6 36.0 - 46.0 %   MCV 93.5 80.0 - 100.0 fL   MCH 30.3 26.0 - 34.0 pg   MCHC 32.5 30.0 - 36.0 g/dL   RDW 11.9 11.5 - 15.5 %   Platelets 252 150 - 400 K/uL   nRBC 0.0 0.0 - 0.2 %   Neutrophils Relative % 69 %   Neutro Abs 7.5 1.7 - 7.7 K/uL   Lymphocytes Relative 24 %   Lymphs Abs 2.7 0.7 - 4.0 K/uL   Monocytes Relative 7 %   Monocytes Absolute 0.8 0.1 - 1.0 K/uL   Eosinophils Relative 0 %   Eosinophils Absolute 0.1 0.0 - 0.5 K/uL   Basophils Relative 0 %   Basophils Absolute 0.1 0.0 - 0.1 K/uL   Immature Granulocytes 0 %   Abs Immature Granulocytes 0.03 0.00 - 0.07 K/uL    Comment: Performed at [facility], [place] [address] [place], [place] [address]  Lactic acid, plasma     Status: None   Collection Time: 12/14/20  6:22 PM  Result Value Ref Range   Lactic Acid, Venous 1.4 0.5 - 1.9 mmol/L    Comment:  Performed at [facility], [place] [address], [place], [place] [address]  Protime-INR     Status: None   Collection Time: 12/14/20  6:22 PM  Result Value Ref Range   Prothrombin Time 13.9 11.4 - 15.2 seconds   INR 1.1 0.8 - 1.2    Comment: (NOTE) INR goal varies based on device and disease states. Performed at [facility], [place] [address] [place], [place] [address]   APTT     Status: Abnormal   Collection Time: 12/14/20  6:22 PM  Result Value Ref Range   aPTT 37 (H) 24 - 36 seconds    Comment:        IF BASELINE aPTT IS ELEVATED, SUGGEST PATIENT RISK ASSESSMENT BE USED TO DETERMINE APPROPRIATE ANTICOAGULANT THERAPY. Performed at [facility], [place] [address], [place], [place] [address]   Resp Panel by RT-PCR (Flu A&B, Covid)     Status: None   Collection Time: 12/14/20  6:22 PM   Specimen: Nasopharyngeal(NP) swabs in vial transport medium  Result Value Ref Range   SARS Coronavirus 2 by RT PCR NEGATIVE NEGATIVE    Comment: (NOTE) SARS-CoV-2 target nucleic acids are NOT DETECTED.  The SARS-CoV-2 RNA is generally detectable in upper respiratory specimens during the acute phase of infection. The lowest concentration of SARS-CoV-2 viral copies this assay can detect is 138 copies/mL. A negative result does not preclude SARS-Cov-2 infection and should not be used as the sole basis for treatment or other patient management decisions. A negative result may occur with  improper specimen collection/handling, submission of specimen other than nasopharyngeal swab, presence of viral mutation(s) within the areas targeted by this assay, and inadequate number of viral copies(<138 copies/mL). A negative result must be combined with clinical observations, patient history, and epidemiological information. The expected result is Negative.  Fact Sheet for Patients:  EntrepreneurPulse.com.au  Fact Sheet for Healthcare  Providers:  IncredibleEmployment.be  This test is no t yet approved or cleared by the [place] FDA and  has been authorized for detection and/or diagnosis of SARS-CoV-2 by FDA under an Emergency Use Authorization (EUA). This EUA will remain  in effect (meaning this test can be used) for the duration of the COVID-19 declaration under Section 564(b)(1) of the  Act, 21 U.S.C.section 360bbb-3(b)(1), unless the authorization is terminated  or revoked sooner.       Influenza A by PCR NEGATIVE NEGATIVE   Influenza B by PCR NEGATIVE NEGATIVE    Comment: (NOTE) The Xpert Xpress SARS-CoV-2/FLU/RSV plus assay is intended as an aid in the diagnosis of influenza from Nasopharyngeal swab specimens and should not be used as a sole basis for treatment. Nasal washings and aspirates are unacceptable for Xpert Xpress SARS-CoV-2/FLU/RSV testing.  Fact Sheet for Patients: EntrepreneurPulse.com.au  Fact Sheet for Healthcare Providers: IncredibleEmployment.be  This test is not yet approved or cleared by the [place] FDA and has been authorized for detection and/or diagnosis of SARS-CoV-2 by FDA under an Emergency Use Authorization (EUA). This EUA will remain in effect (meaning this test can be used) for the duration of the COVID-19 declaration under Section 564(b)(1) of the Act, 21 U.S.C. section 360bbb-3(b)(1), unless the authorization is terminated or revoked.  Performed at [facility], [place] [address] [place], [place] [address]   Lactic acid, plasma     Status: Abnormal   Collection Time: 12/14/20  8:02 PM  Result Value Ref Range   Lactic Acid, Venous 2.2 (HH) 0.5 - 1.9 mmol/L    Comment: CRITICAL RESULT CALLED TO, READ BACK BY AND VERIFIED WITH:  Lyn Hollingshead RN 12/14/20 @ 2119 VS Performed at [facility], [place] [address] [place], [place] [address]   Urinalysis, Routine w reflex  microscopic     Status: Abnormal   Collection Time: 12/14/20  9:18 PM  Result Value Ref Range   Color, Urine YELLOW YELLOW   APPearance CLEAR CLEAR   Specific Gravity, Urine 1.018 1.005 - 1.030   pH 5.0 5.0 - 8.0   Glucose, UA NEGATIVE NEGATIVE mg/dL   Hgb urine dipstick MODERATE (A) NEGATIVE   Bilirubin Urine NEGATIVE NEGATIVE   Ketones, ur NEGATIVE NEGATIVE mg/dL   Protein, ur NEGATIVE NEGATIVE mg/dL   Nitrite NEGATIVE NEGATIVE   Leukocytes,Ua NEGATIVE NEGATIVE   RBC / HPF 0-5 0 - 5 RBC/hpf   WBC, UA 0-5 0 - 5 WBC/hpf   Bacteria, UA NONE SEEN NONE SEEN   Squamous Epithelial / LPF 0-5 0 - 5   Mucus PRESENT     Comment: Performed at [facility], [place] [address], [place], [place] [address]   CT Head Wo Contrast  Result Date: 12/14/2020 CLINICAL DATA:  Mental status change EXAM: CT HEAD WITHOUT CONTRAST TECHNIQUE: Contiguous axial images were obtained from the base of the skull through the vertex without intravenous contrast. COMPARISON:  MRI 08/25/2009 FINDINGS: Brain: No acute territorial infarction, hemorrhage or intracranial mass. Moderate atrophy. Fairly extensive white matter hypodensity likely chronic small vessel ischemic change, progressed compared to prior MRI. Ventricular enlargement likely due to atrophy progression Vascular: No hyperdense vessel.  Carotid vascular calcification. Skull: Normal. Negative for fracture or focal lesion. Sinuses/Orbits: No acute finding. Other: None IMPRESSION: 1. No CT evidence for acute intracranial abnormality. 2. Atrophy and chronic small vessel ischemic changes of the white matter. Electronically Signed   By: Donavan Foil M.D.   On: 12/14/2020 17:44   DG Chest Port 1 View  Result Date: 12/14/2020 CLINICAL DATA:  Confusion.  Dementia. EXAM: PORTABLE CHEST 1 VIEW COMPARISON:  09/28/2019 FINDINGS: Numerous leads and wires project over the chest. Mild hyperinflation. Midline trachea. Normal heart size. No pleural effusion or  pneumothorax. Clear lungs. IMPRESSION: Hyperinflation, without acute disease. Electronically Signed   By: Abigail Miyamoto  M.D.   On: 12/14/2020 19:16    Pending Labs Unresulted Labs (From admission, onward)     Start     Ordered   12/14/20 2215  Respiratory (~20 pathogens) panel by PCR  (Respiratory panel by PCR (~20 pathogens, ~24 hr TAT)  w precautions)  Once,   R        12/14/20 2215   12/14/20 1758  Blood Culture (routine x 2)  (Undifferentiated presentation (screening labs and basic nursing orders))  BLOOD CULTURE X 2,   STAT      12/14/20 1758   12/14/20 1758  Urine Culture  (Undifferentiated presentation (screening labs and basic nursing orders))  ONCE - STAT,   STAT       Question:  Indication  Answer:  Sepsis   12/14/20 1758            Vitals/Pain Today's Vitals   12/14/20 2030 12/14/20 2100 12/14/20 2130 12/14/20 2200  BP: 108/60 (!) 146/58 (!) 103/53 (!) 135/58  Pulse: 69 67 65 66  Resp: 15 14 14 14   Temp: 99.4 F (37.4 C)     TempSrc:      SpO2: 99% 97% 98% 98%  Weight:      Height:        Isolation Precautions Droplet precaution  Medications Medications  acetaminophen (TYLENOL) tablet 650 mg (650 mg Oral Patient Refused/Not Given 12/14/20 1921)  acetaminophen (TYLENOL) suppository 650 mg (650 mg Rectal Given 12/14/20 1929)    Mobility walks High fall risk   Focused Assessments     R Recommendations: See Admitting Provider Note  Report given to:   Additional Notes:

## 2020-12-14 NOTE — ED Notes (Signed)
Pure wick in place will give urine sample when able too. 

## 2020-12-14 NOTE — ED Provider Notes (Signed)
[facility]ERGENCY DEPARTMENT Provider Note  CSN: [id number] Arrival date & time: 12/14/20 1441    History Chief Complaint  Patient presents with   Altered Mental Status    Colleen David is a 75 y.o. female with history of dementia brought to the ED by family for evaluation of a change in her usual mental status describes as sleeping more and talking less. She has also occasionally been grabbing at her right side but not really complaining of any pain. She was seen at UC earlier today where she was noted to have a fever and sent to the ED for further evaluation. Family does not think she was given any medications there. She is unable to give any additional history. Level 5 caveat applies.    Past Medical History:  Diagnosis Date   Dementia ([place])    Hyperlipidemia    Low vitamin B12 level    Nicotine dependence    Restrictive airway disease     Past Surgical History:  Procedure Laterality Date   ROTATOR CUFF REPAIR      Family History  Problem Relation Age of Onset   Lung cancer Mother    Diabetes Mother    Hypertension Mother    Colon cancer Mother    Alcohol abuse Father    Hypertension Sister    Hypertension Sister     Social History   Tobacco Use   Smoking status: Former    Packs/day: 0.50    Years: 55.00    Pack years: 27.50    Types: Cigarettes    Quit date: 2020    Years since quitting: 2.8   Smokeless tobacco: Never  Vaping Use   Vaping Use: Never used  Substance Use Topics   Alcohol use: No   Drug use: No     Home Medications Prior to Admission medications   Medication Sig Start Date End Date Taking? Authorizing Provider  albuterol (VENTOLIN HFA) 108 (90 Base) MCG/ACT inhaler Inhale into the lungs. 11/10/17   [provider]  alendronate (FOSAMAX) 70 MG tablet Take 70 mg by mouth every Sunday. Take with a full glass of water on an empty stomach.    [provider]  aspirin EC 81 MG tablet Take 81 mg by mouth at  bedtime.     [provider]  atorvastatin (LIPITOR) 10 MG tablet Take 10 mg by mouth at bedtime.     [provider]  atorvastatin (LIPITOR) 10 MG tablet Take 1 tablet by mouth daily. 09/12/20   [provider]  BINAXNOW COVID-19 AG HOME TEST KIT TEST AS DIRECTED TODAY 06/25/20   [provider]  donepezil (ARICEPT) 10 MG tablet TAKE 1 TABLET(10 MG) BY MOUTH EVERY NIGHT 07/27/20   [provider]  memantine (NAMENDA XR) 28 MG CP24 24 hr capsule TAKE 1 CAPSULE(28 MG) BY MOUTH AT BEDTIME 07/27/20   [provider]  Memantine HCl-Donepezil HCl 28-10 MG CP24 Take 1 capsule by mouth at bedtime.     [provider]  Multiple Vitamin (MULTIVITAMIN ADULT PO) Take 1 tablet by mouth at bedtime.     [provider]  Multiple Vitamins-Minerals (MULTI COMPLETE/IRON) TABS Take by mouth.    [provider]     Allergies    Patient has no known allergies.   Review of Systems   Review of Systems Unable to assess due to mental status.     Physical Exam BP (!) 135/58   Pulse 66  Temp 99.4 F (37.4 C)   Resp 14   Ht $R'5\' 6"'lc$  (1.676 m)   Wt 65 kg   SpO2 98%   BMI 23.13 kg/m   Physical Exam Vitals and nursing note reviewed.  Constitutional:      Appearance: Normal appearance.  HENT:     Head: Normocephalic and atraumatic.     Nose: Nose normal.     Mouth/Throat:     Mouth: Mucous membranes are moist.  Eyes:     Extraocular Movements: Extraocular movements intact.     Conjunctiva/sclera: Conjunctivae normal.  Cardiovascular:     Rate and Rhythm: Normal rate.  Pulmonary:     Effort: Pulmonary effort is normal.     Breath sounds: Normal breath sounds.  Abdominal:     General: Abdomen is flat.     Palpations: Abdomen is soft.     Tenderness: There is no abdominal tenderness. There is no guarding.  Musculoskeletal:        General: No swelling. Normal range of motion.     Cervical back: Neck supple.  Skin:     General: Skin is warm and dry.  Neurological:     General: No focal deficit present.     Mental Status: She is alert. Mental status is at baseline.  Psychiatric:        Mood and Affect: Mood normal.     ED Results / Procedures / Treatments   Labs (all labs ordered are listed, but only abnormal results are displayed) Labs Reviewed  COMPREHENSIVE METABOLIC PANEL - Abnormal; Notable for the following components:      Result Value   Glucose, Bld 107 (*)    Creatinine, Ser 1.49 (*)    Total Bilirubin 1.7 (*)    GFR, Estimated 37 (*)    All other components within normal limits  CBC WITH DIFFERENTIAL/PLATELET - Abnormal; Notable for the following components:   WBC 11.2 (*)    All other components within normal limits  URINALYSIS, ROUTINE W REFLEX MICROSCOPIC - Abnormal; Notable for the following components:   Hgb urine dipstick MODERATE (*)    All other components within normal limits  LACTIC ACID, PLASMA - Abnormal; Notable for the following components:   Lactic Acid, Venous 2.2 (*)    All other components within normal limits  APTT - Abnormal; Notable for the following components:   aPTT 37 (*)    All other components within normal limits  RESP PANEL BY RT-PCR (FLU A&B, COVID) [id number]  CULTURE, BLOOD (ROUTINE X 2)  CULTURE, BLOOD (ROUTINE X 2)  URINE CULTURE  LACTIC ACID, PLASMA  PROTIME-INR    EKG EKG Interpretation  Date/Time:  Thursday December 14 2020 15:34:58 EST Ventricular Rate:  80 PR Interval:  157 QRS Duration: 131 QT Interval:  419 QTC Calculation: 484 R Axis:   -88 Text Interpretation: Sinus rhythm RBBB and LAFB No significant change since last tracing Confirmed by Calvert Cantor [phone]) on 12/14/2020 5:18:58 PM  Radiology CT Head Wo Contrast  Result Date: 12/14/2020 CLINICAL DATA:  Mental status change EXAM: CT HEAD WITHOUT CONTRAST TECHNIQUE: Contiguous axial images were obtained from the base of the skull through the vertex without intravenous  contrast. COMPARISON:  MRI 08/25/2009 FINDINGS: Brain: No acute territorial infarction, hemorrhage or intracranial mass. Moderate atrophy. Fairly extensive white matter hypodensity likely chronic small vessel ischemic change, progressed compared to prior MRI. Ventricular enlargement likely due to atrophy progression Vascular: No hyperdense vessel.  Carotid vascular calcification. Skull: Normal. Negative  for fracture or focal lesion. Sinuses/Orbits: No acute finding. Other: None IMPRESSION: 1. No CT evidence for acute intracranial abnormality. 2. Atrophy and chronic small vessel ischemic changes of the white matter. Electronically Signed   By: Donavan Foil M.D.   On: 12/14/2020 17:44   DG Chest Port 1 View  Result Date: 12/14/2020 CLINICAL DATA:  Confusion.  Dementia. EXAM: PORTABLE CHEST 1 VIEW COMPARISON:  09/28/2019 FINDINGS: Numerous leads and wires project over the chest. Mild hyperinflation. Midline trachea. Normal heart size. No pleural effusion or pneumothorax. Clear lungs. IMPRESSION: Hyperinflation, without acute disease. Electronically Signed   By: Abigail Miyamoto M.D.   On: 12/14/2020 19:16    Procedures Procedures  Medications Ordered in the ED Medications  acetaminophen (TYLENOL) tablet 650 mg (650 mg Oral Patient Refused/Not Given 12/14/20 1921)  acetaminophen (TYLENOL) suppository 650 mg (650 mg Rectal Given 12/14/20 1929)     MDM Rules/Calculators/A&P MDM Patient with dementia, change in mental status and one recorded fever at UC, not febrile at home and not on arrival here. Will check CBC, CMP and UA. EKG and CT head to screen for acute processes. Recheck temp, consider sepsis workup if becomes febrile, otherwise her vitals are unremarkable.   ED Course  I have reviewed the triage vital signs and the nursing notes.  Pertinent labs & imaging results that were available during my care of the patient were reviewed by me and considered in my medical decision making (see chart for  details).  Clinical Course as of 12/14/20 2215  Thu Dec 14, 2020  1801 Patient is febrile on rectal temp, will add sepsis labs.  [CS]  9476 Will hold off on Abx pending identification of a source.  [CS]  5465 CBC with mildly elevated WBC.  [CS]  1917 Coags normal.  [CS]  1923 CXR is clear.  [CS]  1932 Lactic acid is neg.  [CS]  1932 CMP with CKD, improved from previous.  [CS]  2054 Covid/Flu are neg.  [CS]  2159 UA is neg for infection, no explanation for her fever or confusion. Will discuss admission with Hospitalist, family at bedside is amenable.  [CS]  2209 Spoke with Dr. Sherrye Payor, Hospitalist, who will evaluate the patient for admission.  [CS]    Clinical Course User Index [CS] Truddie Hidden, MD    Final Clinical Impression(s) / ED Diagnoses Final diagnoses:  Altered mental status, unspecified altered mental status type  Fever, unspecified fever cause    Rx / DC Orders ED Discharge Orders     None        Truddie Hidden, MD 12/14/20 2215

## 2020-12-14 NOTE — ED Triage Notes (Signed)
Pt to ER via EMS from urgent care.  Pt presented to urgent care with family with c/o increased confusion and increased urination.  Pt has hx of alzheimer's dementia.  Family voices no other concerns at this time.

## 2020-12-14 NOTE — ED Notes (Signed)
Date and time results received: 12/14/20 2120 (use smartphrase ".now" to insert current time)  Test: lactic acid Critical Value: 2.2  Name of Provider Notified: Karle Starch, MD  Orders Received? Or Actions Taken?:  n/a

## 2020-12-15 DIAGNOSIS — R509 Fever, unspecified: Secondary | ICD-10-CM | POA: Diagnosis not present

## 2020-12-15 DIAGNOSIS — R4182 Altered mental status, unspecified: Secondary | ICD-10-CM | POA: Diagnosis not present

## 2020-12-15 LAB — RESPIRATORY PANEL BY PCR

## 2020-12-15 LAB — BASIC METABOLIC PANEL
Anion gap: 8 (ref 5–15)
BUN: 20 mg/dL (ref 8–23)
CO2: 24 mmol/L (ref 22–32)
Calcium: 8.4 mg/dL — ABNORMAL LOW (ref 8.9–10.3)
Chloride: 109 mmol/L (ref 98–111)
Creatinine, Ser: 1.23 mg/dL — ABNORMAL HIGH (ref 0.44–1.00)
GFR, Estimated: 46 mL/min — ABNORMAL LOW (ref >60)
Glucose, Bld: 101 mg/dL — ABNORMAL HIGH (ref 70–99)
Potassium: 3.7 mmol/L (ref 3.5–5.1)
Sodium: 141 mmol/L (ref 135–145)

## 2020-12-15 LAB — CBC
HCT: 39 % (ref 36.0–46.0)
Hemoglobin: 12.4 g/dL (ref 12.0–15.0)
MCH: 30.6 pg (ref 26.0–34.0)
MCHC: 31.8 g/dL (ref 30.0–36.0)
MCV: 96.3 fL (ref 80.0–100.0)
Platelets: 219 10*3/uL (ref 150–400)
RBC: 4.05 MIL/uL (ref 3.87–5.11)
RDW: 12 % (ref 11.5–15.5)
WBC: 9.1 10*3/uL (ref 4.0–10.5)
nRBC: 0 % (ref 0.0–0.2)

## 2020-12-15 LAB — URINE CULTURE: Culture: NO GROWTH

## 2020-12-15 LAB — LACTIC ACID, PLASMA: Lactic Acid, Venous: 0.8 mmol/L (ref 0.5–1.9)

## 2020-12-15 LAB — PROCALCITONIN: Procalcitonin: <0.1 ng/mL

## 2020-12-15 MED ORDER — DONEPEZIL HCL 10 MG PO TABS
10.0000 mg | ORAL_TABLET | Freq: Every day | ORAL | Status: DC
  Administered 2020-12-15: 10 mg via ORAL
  Filled 2020-12-15: qty 1

## 2020-12-15 MED ORDER — MEMANTINE HCL ER 28 MG PO CP24
28.0000 mg | ORAL_CAPSULE | Freq: Every day | ORAL | Status: DC
  Administered 2020-12-15: 28 mg via ORAL
  Filled 2020-12-15 (×2): qty 1

## 2020-12-15 NOTE — Discharge Summary (Addendum)
Discharge Summary  Colleen David UXL:[id number] DOB: December 16, 1945  PCP: Berkley Harvey, NP  Admit date: 12/14/2020 Discharge date: 12/15/2020  Time spent:  57mins  Recommendations for Outpatient Follow-up:  F/u with PCP within a week  for hospital discharge follow up, repeat cbc/bmp at follow up.  Home health arranged      Discharge Diagnoses:  Active Hospital Problems   Diagnosis Date Noted   Febrile illness 12/14/2020   Altered mental status 12/14/2020   CKD (chronic kidney disease), stage III ([place]) 12/14/2020   Vascular dementia without behavioral disturbance ([place]) 11/07/2019   Mixed hyperlipidemia 03/08/2015   COPD (chronic obstructive pulmonary disease) ([place]) 09/22/2009    Resolved Hospital Problems  No resolved problems to display.    Discharge Condition: stable  Diet recommendation: heart healthy/carb modified  Filed Weights   12/14/20 1540  Weight: 65 kg    History of present illness: (Per admitting MD Dr. Posey Pronto ) Chief Complaint: Altered mental status   HPI: Colleen David is a 75 y.o. female with medical history significant for vascular dementia, COPD, hyperlipidemia, CKD stage IIIb, vitamin B12 deficiency who presented to the ED from home for evaluation of change in mental status.  History is limited from patient due to altered mental status/dementia and is otherwise obtained by EDP, chart review, and son at bedside.  Per son patient currently lives at home with her son.  Son states that at baseline patient is ambulatory and converses fairly well but does have short-term memory issues.  She is able to feed herself.  Over the last few days he has noticed that she has been grabbing at her right flank area, possibly due to pain.  She had a couple episodes of incontinence in her briefs.  Over the last day she has had worsening mental status with significant fatigue/lethargy and decreased communication.  She has been staying in bed.  He says this is a  significant change from her baseline.  She has not had any recent changes in her medications.   ED Course:  Initial vitals showed BP 144/54, pulse 83, RR 18, temp 97.4 F, SPO2 98% on room air.  Rectal temperature 101.2 F.   Labs show WBC 11.2, hemoglobin 13.5, platelets 252,000, sodium 141, potassium 3.6, bicarb 25, BUN 23, creatinine 1.49, serum glucose 107, lactic acid 1.4 > 2.2.   Urinalysis is negative for UTI.  Blood and urine cultures collected and pending.  SARS-CoV-2 and influenza are negative.   CT head without contrast is negative for evidence of acute intracranial abnormality.  Atrophy and chronic small vessel ischemic changes of white matter noted.   Portable chest x-ray shows hyperinflated lung fields without focal consolidation, edema, or effusion.   Patient was given rectal Tylenol and the hospitalist service was consulted to admit for further evaluation and management.  Hospital Course:  Principal Problem:   Febrile illness Active Problems:   COPD (chronic obstructive pulmonary disease) (HCC)   Mixed hyperlipidemia   Vascular dementia without behavioral disturbance (HCC)   Altered mental status   CKD (chronic kidney disease), stage III (HCC)  Febrile illness with altered mental status/acute metabolic encephalopathy -She had fever initially in the ED, has been fever free without intervention since admission -COVID influenza negative -Respiratory viral panel(20) all negative -Blood culture no growth -Chest x-ray no acute findings -UA negative for UTI -Abdominal exam benign, she denies pain -She did appear dehydrated on admission, she received hydration, all lab work has normalized after hydration -Her mental  status has come back to baseline per son, son agrees to take patient home with close outpatient PCP follow-up  Vascular dementia: Increased somnolence from baseline in setting of febrile illness.  Continue home memantine-donepezil and delirium precautions.    CKD stage IIIb: Currently stable.  Continue to monitor.   COPD: Stable, no acute respiratory issue.  Continue albuterol as needed.   Hyperlipidemia: Continue atorvastatin.  FTT, seen by PT, home health PT recommended and order placed  Procedures: none  Consultations: Case manager PT  Discharge Exam: BP 130/63 (BP Location: Right Arm)   Pulse 64   Temp 98.1 F (36.7 C) (Oral)   Resp 19   Ht 5\' 6"  (1.676 m)   Wt 65 kg   SpO2 96%   BMI 23.13 kg/m   General: NAD, pleasantly demented, very talkative ,does not remember her birthday, not oriented to time or to place Cardiovascular: RRR Respiratory: Normal respiratory effort  Discharge Instructions You were cared for by a hospitalist during your hospital stay. If you have any questions about your discharge medications or the care you received while you were in the hospital after you are discharged, you can call the unit and asked to speak with the hospitalist on call if the hospitalist that took care of you is not available. Once you are discharged, your primary care physician will handle any further medical issues. Please note that NO REFILLS for any discharge medications will be authorized once you are discharged, as it is imperative that you return to your primary care physician (or establish a relationship with a primary care physician if you do not have one) for your aftercare needs so that they can reassess your need for medications and monitor your lab values.  Discharge Instructions     Diet general   Complete by: As directed    Increase activity slowly   Complete by: As directed       Allergies as of 12/15/2020   No Known Allergies      Medication List     TAKE these medications    aspirin EC 81 MG tablet Take 81 mg by mouth at bedtime.   atorvastatin 10 MG tablet Commonly known as: LIPITOR Take 10 mg by mouth at bedtime.   Memantine HCl-Donepezil HCl 28-10 MG Cp24 Take 1 capsule by mouth at  bedtime.       No Known Allergies  Follow-up Information     Berkley Harvey, NP Follow up in 1 week(s).   Specialty: Nurse Practitioner Why: hospital discharge follow up, repeat cbc/bmp at follow up Contact information: [place] [address] [phone]                  The results of significant diagnostics from this hospitalization (including imaging, microbiology, ancillary and laboratory) are listed below for reference.    Significant Diagnostic Studies: CT Head Wo Contrast  Result Date: 12/14/2020 CLINICAL DATA:  Mental status change EXAM: CT HEAD WITHOUT CONTRAST TECHNIQUE: Contiguous axial images were obtained from the base of the skull through the vertex without intravenous contrast. COMPARISON:  MRI 08/25/2009 FINDINGS: Brain: No acute territorial infarction, hemorrhage or intracranial mass. Moderate atrophy. Fairly extensive white matter hypodensity likely chronic small vessel ischemic change, progressed compared to prior MRI. Ventricular enlargement likely due to atrophy progression Vascular: No hyperdense vessel.  Carotid vascular calcification. Skull: Normal. Negative for fracture or focal lesion. Sinuses/Orbits: No acute finding. Other: None IMPRESSION: 1. No CT evidence  for acute intracranial abnormality. 2. Atrophy and chronic small vessel ischemic changes of the white matter. Electronically Signed   By: Donavan Foil M.D.   On: 12/14/2020 17:44   DG Chest Port 1 View  Result Date: 12/14/2020 CLINICAL DATA:  Confusion.  Dementia. EXAM: PORTABLE CHEST 1 VIEW COMPARISON:  09/28/2019 FINDINGS: Numerous leads and wires project over the chest. Mild hyperinflation. Midline trachea. Normal heart size. No pleural effusion or pneumothorax. Clear lungs. IMPRESSION: Hyperinflation, without acute disease. Electronically Signed   By: Abigail Miyamoto M.D.   On: 12/14/2020 19:16    Microbiology: Recent Results (from the past 240 hour(s))  Blood  Culture (routine x 2)     Status: None (Preliminary result)   Collection Time: 12/14/20  6:22 PM   Specimen: BLOOD  Result Value Ref Range Status   Specimen Description   Final    BLOOD BLOOD LEFT FOREARM Performed at [facility], [place] [address], [place], [place] [address]    Special Requests   Final    BOTTLES DRAWN AEROBIC AND ANAEROBIC Blood Culture adequate volume Performed at [facility] [address], [place], [place] [address]    Culture   Final    NO GROWTH < 12 HOURS Performed at [facility] [address], [place], [place] [address]    Report Status PENDING  Incomplete  Blood Culture (routine x 2)     Status: None (Preliminary result)   Collection Time: 12/14/20  6:22 PM   Specimen: BLOOD  Result Value Ref Range Status   Specimen Description   Final    BLOOD LEFT ANTECUBITAL Performed at [facility] [address] [place] [address], [place], [place] [address]    Special Requests   Final    BOTTLES DRAWN AEROBIC AND ANAEROBIC Blood Culture adequate volume Performed at [facility] [address], [place], [place] [address]    Culture   Final    NO GROWTH < 12 HOURS Performed at [facility] [address] [place], [place] [address]    Report Status PENDING  Incomplete  Resp Panel by RT-PCR (Flu A&B, Covid)     Status: None   Collection Time: 12/14/20  6:22 PM   Specimen: Nasopharyngeal(NP) swabs in vial transport medium  Result Value Ref Range Status   SARS Coronavirus 2 by RT PCR NEGATIVE NEGATIVE Final    Comment: (NOTE) SARS-CoV-2 target nucleic acids are NOT DETECTED.  The SARS-CoV-2 RNA is generally detectable in upper respiratory specimens during the acute phase of infection. The lowest concentration of SARS-CoV-2 viral copies this assay can detect is 138 copies/mL. A negative result does not preclude SARS-Cov-2 infection and should not be used as the sole basis for treatment  or other patient management decisions. A negative result may occur with  improper specimen collection/handling, submission of specimen other than nasopharyngeal swab, presence of viral mutation(s) within the areas targeted by this assay, and inadequate number of viral copies(<138 copies/mL). A negative result must be combined with clinical observations, patient history, and epidemiological information. The expected result is Negative.  Fact Sheet for Patients:  EntrepreneurPulse.com.au  Fact Sheet for Healthcare Providers:  IncredibleEmployment.be  This test is no t yet approved or cleared by the [place] FDA and  has been authorized for detection and/or diagnosis of SARS-CoV-2 by FDA under an Emergency Use Authorization (EUA). This EUA will remain  in effect (meaning this test can be used) for the duration of  the COVID-19 declaration under Section 564(b)(1) of the Act, 21 U.S.C.section 360bbb-3(b)(1), unless the authorization is terminated  or revoked sooner.       Influenza A by PCR NEGATIVE NEGATIVE Final   Influenza B by PCR NEGATIVE NEGATIVE Final    Comment: (NOTE) The Xpert Xpress SARS-CoV-2/FLU/RSV plus assay is intended as an aid in the diagnosis of influenza from Nasopharyngeal swab specimens and should not be used as a sole basis for treatment. Nasal washings and aspirates are unacceptable for Xpert Xpress SARS-CoV-2/FLU/RSV testing.  Fact Sheet for Patients: EntrepreneurPulse.com.au  Fact Sheet for Healthcare Providers: IncredibleEmployment.be  This test is not yet approved or cleared by the [place] FDA and has been authorized for detection and/or diagnosis of SARS-CoV-2 by FDA under an Emergency Use Authorization (EUA). This EUA will remain in effect (meaning this test can be used) for the duration of the COVID-19 declaration under Section 564(b)(1) of the Act, 21 U.S.C. section  360bbb-3(b)(1), unless the authorization is terminated or revoked.  Performed at [facility], [place] [address], [place], [place] [address]   Respiratory (~20 pathogens) panel by PCR     Status: None   Collection Time: 12/14/20 11:23 PM   Specimen: Nasopharyngeal Swab; Respiratory  Result Value Ref Range Status   Adenovirus NOT DETECTED NOT DETECTED Final   Coronavirus 229E NOT DETECTED NOT DETECTED Final    Comment: (NOTE) The Coronavirus on the Respiratory Panel, DOES NOT test for the novel  Coronavirus (2019 nCoV)    Coronavirus HKU1 NOT DETECTED NOT DETECTED Final   Coronavirus NL63 NOT DETECTED NOT DETECTED Final   Coronavirus OC43 NOT DETECTED NOT DETECTED Final   Metapneumovirus NOT DETECTED NOT DETECTED Final   Rhinovirus / Enterovirus NOT DETECTED NOT DETECTED Final   Influenza A NOT DETECTED NOT DETECTED Final   Influenza B NOT DETECTED NOT DETECTED Final   Parainfluenza Virus 1 NOT DETECTED NOT DETECTED Final   Parainfluenza Virus 2 NOT DETECTED NOT DETECTED Final   Parainfluenza Virus 3 NOT DETECTED NOT DETECTED Final   Parainfluenza Virus 4 NOT DETECTED NOT DETECTED Final   Respiratory Syncytial Virus NOT DETECTED NOT DETECTED Final   Bordetella pertussis NOT DETECTED NOT DETECTED Final   Bordetella Parapertussis NOT DETECTED NOT DETECTED Final   Chlamydophila pneumoniae NOT DETECTED NOT DETECTED Final   Mycoplasma pneumoniae NOT DETECTED NOT DETECTED Final    Comment: Performed at [facility], [place]. [address], [place], [place] [address]     Labs: Basic Metabolic Panel: Recent Labs  Lab 12/14/20 1822 12/15/20 0508  NA 141 141  K 3.6 3.7  CL 108 109  CO2 25 24  GLUCOSE 107* 101*  BUN 23 20  CREATININE 1.49* 1.23*  CALCIUM 9.0 8.4*   Liver Function Tests: Recent Labs  Lab 12/14/20 1822  AST 20  ALT 12  ALKPHOS 78  BILITOT 1.7*  PROT 8.0  ALBUMIN 3.9   No results for input(s): LIPASE, AMYLASE in the last 168 hours. No  results for input(s): AMMONIA in the last 168 hours. CBC: Recent Labs  Lab 12/14/20 1822 12/15/20 0508  WBC 11.2* 9.1  NEUTROABS 7.5  --   HGB 13.5 12.4  HCT 41.6 39.0  MCV 93.5 96.3  PLT 252 219   Cardiac Enzymes: No results for input(s): CKTOTAL, CKMB, CKMBINDEX, TROPONINI in the last 168 hours. BNP: BNP (last 3 results) No results for input(s): BNP in the last 8760 hours.  ProBNP (last 3 results) No results for  input(s): PROBNP in the last 8760 hours.  CBG: No results for input(s): GLUCAP in the last 168 hours.     Signed:  Florencia Reasons MD, PhD, FACP  [facility] 12/15/2020, 2:02 PM

## 2020-12-15 NOTE — TOC Initial Note (Signed)
Transition of Care [facility]) - Initial/Assessment Note    Patient Details  Name: Colleen David MRN: [id number] Date of Birth: Jan 06, 1946  [facility] CM/SW Contact:    Lynnell Catalan, RN Phone Number: 12/15/2020, 3:38 PM  Clinical Narrative:                   Expected Discharge Plan: [place] Barriers to Discharge: No Barriers Identified   Patient Goals and CMS Choice     Choice offered to / list presented to : Adult Children  Expected Discharge Plan and Services Expected Discharge Plan: [place] Acute Care Choice: Home Health   Expected Discharge Date: 12/15/20                         HH Arranged: PT HH Agency: [place] Date [facility] Agency Contacted: 12/15/20 Time HH Agency Contacted: 45 Representative spoke with at [place]: Tommi Rumps  Prior Living Arrangements/Services              Need for Family Participation in Patient Care: Yes (Comment) Care giver support system in place?: Yes (comment)   Criminal Activity/Legal Involvement Pertinent to Current Situation/Hospitalization: No - Comment as needed  Activities of Daily Living Home Assistive Devices/Equipment: None ADL Screening (condition at time of admission) Patient's cognitive ability adequate to safely complete daily activities?: No Is the patient deaf or have difficulty hearing?: No Does the patient have difficulty seeing, even when wearing glasses/contacts?: No Does the patient have difficulty concentrating, remembering, or making decisions?: Yes Patient able to express need for assistance with ADLs?: No Does the patient have difficulty dressing or bathing?: Yes Independently performs ADLs?: No Communication: Independent Dressing (OT): Needs assistance Is this a change from baseline?: Pre-admission baseline Grooming: Needs assistance Is this a change from baseline?: Pre-admission baseline Feeding: Independent Bathing:  Needs assistance Is this a change from baseline?: Pre-admission baseline Toileting: Needs assistance Is this a change from baseline?: Change from baseline, expected to last <3 days In/Out Bed: Needs assistance Walks in Home: Independent Does the patient have difficulty walking or climbing stairs?: No Weakness of Legs: None Weakness of Arms/Hands: None  Permission Sought/Granted                  Emotional Assessment              Admission diagnosis:  Febrile illness [R50.9] Fever, unspecified fever cause [R50.9] Altered mental status, unspecified altered mental status type [R41.82] Patient Active Problem List   Diagnosis Date Noted   Febrile illness 12/14/2020   Altered mental status 12/14/2020   CKD (chronic kidney disease), stage III ([place]) 12/14/2020   Elevated serum creatinine 11/07/2019   Vascular dementia without behavioral disturbance ([place]) 11/07/2019   B12 deficiency 09/30/2016   Abnormality of right breast on screening mammogram 07/10/2016   Low bone mass 04/14/2015   Mixed hyperlipidemia 03/08/2015   Advanced care planning/counseling discussion 03/01/2015   Mild cognitive impairment with memory loss 03/01/2015   Simple chronic bronchitis ([place]) 03/01/2015   Weight loss, unintentional 03/01/2015   TOBACCO ABUSE 09/22/2009   COPD (chronic obstructive pulmonary disease) ([place]) 09/22/2009   SYNCOPE 09/22/2009   Nicotine dependence, cigarettes, uncomplicated 98/33/8250   PCP:  Berkley Harvey, NP Pharmacy:   [facility] [place], [place] - [address] AT [address] [place]  [place] [address] Phone: [phone] Fax: [phone]     Social Determinants of Health (SDOH) Interventions    Readmission Risk Interventions No flowsheet data found.

## 2020-12-15 NOTE — Evaluation (Signed)
Physical Therapy Evaluation Patient Details Name: Colleen David MRN: [id number] DOB: 1945/07/24 Today's Date: 12/15/2020  History of Present Illness  75yo female who presented on 11/10 with AMS/altered behavior on top of dementia. Admitted with febrile illness with AMS. PMH dementia, HLD, restrictive airway disease, rotator cuff repair  Clinical Impression   Ms. Rickert presents in bed, very pleasantly confused and very physically resistant to mobility today; suspect this was due to dementia and being in unfamiliar environment. Very strong and able to resist me well today- general functional strength estimated to be 5/5 on MMT scale. Needed lots of coaxing, got part of the way to EOB multiple times but we were unable to continue/attempt gait and transfers due to her physical resistance. I do suspect that once she is in her familiar home environment she will likely be able to mobilize well, son educated. Made multiple attempts at mobility with different goals and cues, however unable to get further than partial EOB mobility. Left in bed positioned to comfort with all needs met, son present, bed alarm active and RN aware of patient status. Will benefit from HHPT f/u and constant S at DC.      Recommendations for follow up therapy are one component of a multi-disciplinary discharge planning process, led by the attending physician.  Recommendations may be updated based on patient status, additional functional criteria and insurance authorization.  Follow Up Recommendations Home health PT    Assistance Recommended at Discharge Frequent or constant Supervision/Assistance  Functional Status Assessment Patient has had a recent decline in their functional status and demonstrates the ability to make significant improvements in function in a reasonable and predictable amount of time.  Equipment Recommendations  None recommended by PT    Recommendations for Other Services       Precautions /  Restrictions Precautions Precautions: Fall Precaution Comments: dementia, incontinence Restrictions Weight Bearing Restrictions: No      Mobility  Bed Mobility               General bed mobility comments: very strong, able to get part of the way to EOB but physically resisting the entire way; unable to get to EOB today due to physical resistance    Transfers                   General transfer comment: deferred/refused    Ambulation/Gait               General Gait Details: refused  Stairs            Wheelchair Mobility    Modified Rankin (Stroke Patients Only)       Balance                                             Pertinent Vitals/Pain Pain Assessment: Faces Pain Score: 0-No pain Faces Pain Scale: No hurt    Home Living Family/patient expects to be discharged to:: Private residence Living Arrangements: Children;Other (Comment) (has a nurse that covers during the day while son is at work) Available Help at Discharge: Family;Available 24 hours/day;Other (Comment) (home nurse) Type of Home: House Home Access: Level entry     Alternate Level Stairs-Number of Steps: flight Home Layout: Two level Home Equipment: Shower seat;BSC/3in1 Additional Comments: no falls or close calls since last August when she may have had a UTI  Prior Function Prior Level of Function : Needs assist  Cognitive Assist : Mobility (cognitive);ADLs (cognitive) Mobility (Cognitive): Intermittent cues ADLs (Cognitive): Intermittent cues Physical Assist : ADLs (physical)   ADLs (physical): Dressing;Bathing         Hand Dominance        Extremity/Trunk Assessment   Upper Extremity Assessment Upper Extremity Assessment: Overall WFL for tasks assessed    Lower Extremity Assessment Lower Extremity Assessment: Overall WFL for tasks assessed    Cervical / Trunk Assessment Cervical / Trunk Assessment: Kyphotic  Communication    Communication: No difficulties  Cognition Arousal/Alertness: Awake/alert Behavior During Therapy: Flat affect;WFL for tasks assessed/performed Overall Cognitive Status: History of cognitive impairments - at baseline                                 General Comments: dementia at baseline- per son, "can be stubborn and does what she wants"        General Comments General comments (skin integrity, edema, etc.): refused EOB/OOB    Exercises     Assessment/Plan    PT Assessment Patient needs continued PT services  PT Problem List Decreased cognition;Decreased activity tolerance;Decreased safety awareness;Decreased balance;Decreased mobility       PT Treatment Interventions DME instruction;Balance training;Gait training;Stair training;Functional mobility training;Patient/family education;Therapeutic activities;Therapeutic exercise    PT Goals (Current goals can be found in the Care Plan section)  Acute Rehab PT Goals Patient Stated Goal: go home today PT Goal Formulation: With family Time For Goal Achievement: 12/29/20 Potential to Achieve Goals: Good    Frequency Min 3X/week   Barriers to discharge        Co-evaluation               AM-PAC PT "6 Clicks" Mobility  Outcome Measure Help needed turning from your back to your side while in a flat bed without using bedrails?: A Little Help needed moving from lying on your back to sitting on the side of a flat bed without using bedrails?: A Little Help needed moving to and from a bed to a chair (including a wheelchair)?: A Little Help needed standing up from a chair using your arms (e.g., wheelchair or bedside chair)?: A Little Help needed to walk in hospital room?: A Little Help needed climbing 3-5 steps with a railing? : A Lot 6 Click Score: 17    End of Session   Activity Tolerance: Patient tolerated treatment well Patient left: in bed;with call bell/phone within reach;with bed alarm set Nurse  Communication: Mobility status PT Visit Diagnosis: Other abnormalities of gait and mobility (R26.89);Unsteadiness on feet (R26.81)    Time: 1330-1402 PT Time Calculation (min) (ACUTE ONLY): 32 min   Charges:   PT Evaluation $PT Eval Low Complexity: 1 Low PT Treatments $Self Care/Home Management: 8-22       Windell Norfolk, DPT, PN2   Supplemental Physical Therapist Naomi    Pager [phone] Acute Rehab Office [phone]

## 2020-12-19 LAB — CULTURE, BLOOD (ROUTINE X 2)
Culture: NO GROWTH
Culture: NO GROWTH
Special Requests: ADEQUATE
Special Requests: ADEQUATE

## 2020-12-26 ENCOUNTER — Ambulatory Visit: Payer: Medicare Other | Admitting: Podiatry

## 2021-01-05 ENCOUNTER — Ambulatory Visit: Payer: Medicare Other | Admitting: Podiatry

## 2021-01-09 ENCOUNTER — Ambulatory Visit (INDEPENDENT_AMBULATORY_CARE_PROVIDER_SITE_OTHER): Payer: Medicare Other | Admitting: Podiatry

## 2021-01-09 ENCOUNTER — Other Ambulatory Visit: Payer: Self-pay

## 2021-01-09 DIAGNOSIS — B351 Tinea unguium: Secondary | ICD-10-CM | POA: Diagnosis not present

## 2021-01-09 NOTE — Progress Notes (Signed)
  Subjective:  Patient ID: Colleen David, female    DOB: 23-May-1945,  MRN: [id number]  No chief complaint on file.  75 y.o. female presents with the above complaint. History confirmed with patient. Denies pain in the nails today. Objective:  Physical Exam: warm, good capillary refill, nail exam mildly dystrophic nails, no trophic changes or ulcerative lesions, normal DP and PT pulses and normal sensory exam. Left Foot: normal exam, no swelling, tenderness, instability; ligaments intact, full range of motion of all ankle/foot joints  Right Foot: normal exam, no swelling, tenderness, instability; ligaments intact, full range of motion of all ankle/foot joints  Assessment:   1. Onychomycosis    Plan:  Patient was evaluated and treated and all questions answered.  Onychomycosis -Nails debrided. Patient denies pain today.  Procedure: Nail Debridement Type of Debridement: manual, sharp debridement. Instrumentation: Nail nipper, rotary burr. Number of Nails: 10   No follow-ups on file.

## 2021-04-09 ENCOUNTER — Ambulatory Visit (INDEPENDENT_AMBULATORY_CARE_PROVIDER_SITE_OTHER): Payer: Medicare Other | Admitting: Podiatry

## 2021-04-09 ENCOUNTER — Other Ambulatory Visit: Payer: Self-pay

## 2021-04-09 ENCOUNTER — Encounter: Payer: Self-pay | Admitting: Podiatry

## 2021-04-09 DIAGNOSIS — M79609 Pain in unspecified limb: Secondary | ICD-10-CM | POA: Diagnosis not present

## 2021-04-09 DIAGNOSIS — N183 Chronic kidney disease, stage 3 unspecified: Secondary | ICD-10-CM

## 2021-04-09 DIAGNOSIS — B351 Tinea unguium: Secondary | ICD-10-CM | POA: Diagnosis not present

## 2021-04-09 NOTE — Progress Notes (Signed)
This patient returns to my office for at risk foot care.  This patient requires this care by a professional since this patient will be at risk due to having CKD.  This patient is unable to cut nails herself since the patient cannot reach her nails.These nails are painful walking and wearing shoes. She presents with her caregiver. This patient presents for at risk foot care today.  General Appearance  Alert, conversant and in no acute stress.  Vascular  Dorsalis pedis and posterior tibial  pulses are palpable  bilaterally.  Capillary return is within normal limits  bilaterally. Temperature is within normal limits  bilaterally.  Neurologic  Senn-Weinstein monofilament wire test within normal limits  bilaterally. Muscle power within normal limits bilaterally.  Nails Thick disfigured discolored nails with subungual debris  from hallux to fifth toes bilaterally. No evidence of bacterial infection or drainage bilaterally.  Orthopedic  No limitations of motion  feet .  No crepitus or effusions noted.  No bony pathology or digital deformities noted.  Skin  normotropic skin with no porokeratosis noted bilaterally.  No signs of infections or ulcers noted.     Onychomycosis  Pain in right toes  Pain in left toes  Consent was obtained for treatment procedures.   Mechanical debridement of nails 1-5  bilaterally performed with a nail nipper.  Filed with dremel without incident.    Return office visit  3 months                    Told patient to return for periodic foot care and evaluation due to potential at risk complications.   Mikiyah Glasner DPM   

## 2021-04-10 ENCOUNTER — Ambulatory Visit: Payer: Medicare Other | Admitting: Podiatry

## 2021-04-11 ENCOUNTER — Ambulatory Visit: Payer: Medicare Other | Admitting: Podiatry

## 2021-07-09 ENCOUNTER — Ambulatory Visit: Payer: Medicare Other | Admitting: Podiatry

## 2021-07-10 ENCOUNTER — Ambulatory Visit: Payer: Medicare Other | Admitting: Podiatry

## 2021-07-13 ENCOUNTER — Ambulatory Visit: Payer: Medicare Other | Admitting: Podiatry

## 2021-07-25 ENCOUNTER — Ambulatory Visit (INDEPENDENT_AMBULATORY_CARE_PROVIDER_SITE_OTHER): Payer: Medicare Other | Admitting: Podiatry

## 2021-07-25 ENCOUNTER — Encounter: Payer: Self-pay | Admitting: Podiatry

## 2021-07-25 DIAGNOSIS — B351 Tinea unguium: Secondary | ICD-10-CM

## 2021-07-25 DIAGNOSIS — M79674 Pain in right toe(s): Secondary | ICD-10-CM

## 2021-07-25 DIAGNOSIS — M79675 Pain in left toe(s): Secondary | ICD-10-CM

## 2021-07-25 DIAGNOSIS — N183 Chronic kidney disease, stage 3 unspecified: Secondary | ICD-10-CM

## 2021-07-25 NOTE — Progress Notes (Signed)
This patient returns to my office for at risk foot care.  This patient requires this care by a professional since this patient will be at risk due to having CKD.  This patient is unable to cut nails herself since the patient cannot reach her nails.These nails are painful walking and wearing shoes. She presents with her caregiver. This patient presents for at risk foot care today.  General Appearance  Alert, conversant and in no acute stress.  Vascular  Dorsalis pedis and posterior tibial  pulses are palpable  bilaterally.  Capillary return is within normal limits  bilaterally. Temperature is within normal limits  bilaterally.  Neurologic  Senn-Weinstein monofilament wire test within normal limits  bilaterally. Muscle power within normal limits bilaterally.  Nails Thick disfigured discolored nails with subungual debris  from hallux to fifth toes bilaterally. No evidence of bacterial infection or drainage bilaterally.  Orthopedic  No limitations of motion  feet .  No crepitus or effusions noted.  No bony pathology or digital deformities noted.  Skin  normotropic skin with no porokeratosis noted bilaterally.  No signs of infections or ulcers noted.     Onychomycosis  Pain in right toes  Pain in left toes  Consent was obtained for treatment procedures.   Mechanical debridement of nails 1-5  bilaterally performed with a nail nipper.  Filed with dremel without incident.    Return office visit  3 months                    Told patient to return for periodic foot care and evaluation due to potential at risk complications.   Manjot Hinks DPM   

## 2021-10-30 ENCOUNTER — Ambulatory Visit: Payer: Medicare Other | Admitting: Podiatry

## 2021-11-12 ENCOUNTER — Ambulatory Visit: Payer: Medicare Other | Admitting: Podiatry

## 2021-12-11 ENCOUNTER — Ambulatory Visit (INDEPENDENT_AMBULATORY_CARE_PROVIDER_SITE_OTHER): Payer: Medicare Other | Admitting: Podiatry

## 2021-12-11 ENCOUNTER — Encounter: Payer: Self-pay | Admitting: Podiatry

## 2021-12-11 DIAGNOSIS — M79609 Pain in unspecified limb: Secondary | ICD-10-CM | POA: Diagnosis not present

## 2021-12-11 DIAGNOSIS — N183 Chronic kidney disease, stage 3 unspecified: Secondary | ICD-10-CM

## 2021-12-11 DIAGNOSIS — B351 Tinea unguium: Secondary | ICD-10-CM

## 2021-12-11 NOTE — Progress Notes (Signed)
This patient returns to my office for at risk foot care.  This patient requires this care by a professional since this patient will be at risk due to having CKD.  This patient is unable to cut nails herself since the patient cannot reach her nails.These nails are painful walking and wearing shoes. She presents with her caregiver. This patient presents for at risk foot care today.  General Appearance  Alert, conversant and in no acute stress.  Vascular  Dorsalis pedis and posterior tibial  pulses are palpable  bilaterally.  Capillary return is within normal limits  bilaterally. Temperature is within normal limits  bilaterally.  Neurologic  Senn-Weinstein monofilament wire test within normal limits  bilaterally. Muscle power within normal limits bilaterally.  Nails Thick disfigured discolored nails with subungual debris  from hallux to fifth toes bilaterally. No evidence of bacterial infection or drainage bilaterally.  Orthopedic  No limitations of motion  feet .  No crepitus or effusions noted.  No bony pathology or digital deformities noted.  Skin  normotropic skin with no porokeratosis noted bilaterally.  No signs of infections or ulcers noted.     Onychomycosis  Pain in right toes  Pain in left toes  Consent was obtained for treatment procedures.   Mechanical debridement of nails 1-5  bilaterally performed with a nail nipper.  Filed with dremel without incident.    Return office visit  3 months                    Told patient to return for periodic foot care and evaluation due to potential at risk complications.   Gardiner Barefoot DPM

## 2022-03-20 ENCOUNTER — Encounter: Payer: Self-pay | Admitting: Podiatry

## 2022-03-20 ENCOUNTER — Ambulatory Visit (INDEPENDENT_AMBULATORY_CARE_PROVIDER_SITE_OTHER): Payer: 59 | Admitting: Podiatry

## 2022-03-20 VITALS — BP 135/66 | HR 74

## 2022-03-20 DIAGNOSIS — N183 Chronic kidney disease, stage 3 unspecified: Secondary | ICD-10-CM | POA: Diagnosis not present

## 2022-03-20 DIAGNOSIS — M79676 Pain in unspecified toe(s): Secondary | ICD-10-CM

## 2022-03-20 DIAGNOSIS — M79609 Pain in unspecified limb: Secondary | ICD-10-CM

## 2022-03-20 DIAGNOSIS — B351 Tinea unguium: Secondary | ICD-10-CM | POA: Diagnosis not present

## 2022-03-20 NOTE — Progress Notes (Signed)
This patient returns to my office for at risk foot care.  This patient requires this care by a professional since this patient will be at risk due to having CKD.  This patient is unable to cut nails herself since the patient cannot reach her nails.These nails are painful walking and wearing shoes. She presents with her sister. This patient presents for at risk foot care today.  General Appearance  Alert, conversant and in no acute stress.  Vascular  Dorsalis pedis and posterior tibial  pulses are palpable  bilaterally.  Capillary return is within normal limits  bilaterally. Temperature is within normal limits  bilaterally.  Neurologic  Senn-Weinstein monofilament wire test within normal limits  bilaterally. Muscle power within normal limits bilaterally.  Nails Thick disfigured discolored nails with subungual debris  from hallux to fifth toes bilaterally. No evidence of bacterial infection or drainage bilaterally.  Orthopedic  No limitations of motion  feet .  No crepitus or effusions noted.  No bony pathology or digital deformities noted.  Skin  normotropic skin with no porokeratosis noted bilaterally.  No signs of infections or ulcers noted.     Onychomycosis  Pain in right toes  Pain in left toes  Consent was obtained for treatment procedures.   Mechanical debridement of nails 1-5  bilaterally performed with a nail nipper.  Filed with dremel without incident.    Return office visit  3 months                    Told patient to return for periodic foot care and evaluation due to potential at risk complications.   Gardiner Barefoot DPM

## 2022-04-08 IMAGING — CT CT HEAD W/O CM
3 series · 15 of 47 positions shown, 18 images · non-contrast
Comparison: MRI 08/25/2009

CLINICAL DATA: Mental status change

EXAM:
CT HEAD WITHOUT CONTRAST
TECHNIQUE: Contiguous axial images were obtained from the base of the skull
through the vertex without intravenous contrast.

[Series 2: head wo · axial · 0.47mm/px · z∈[+1673,+1798]mm · 9 of 30 slices shown, 12 images]
[im 3/30  brain]
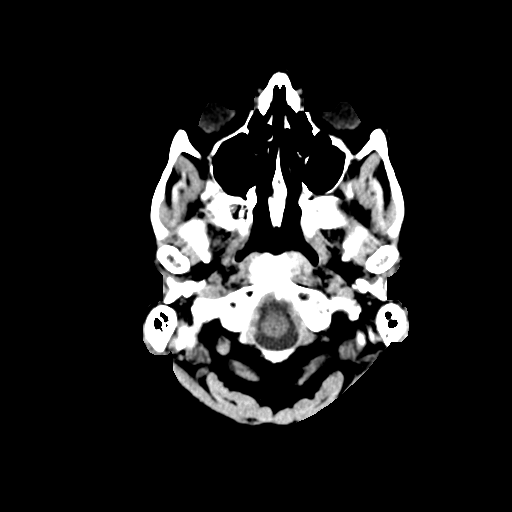
[im 3/30  bone]
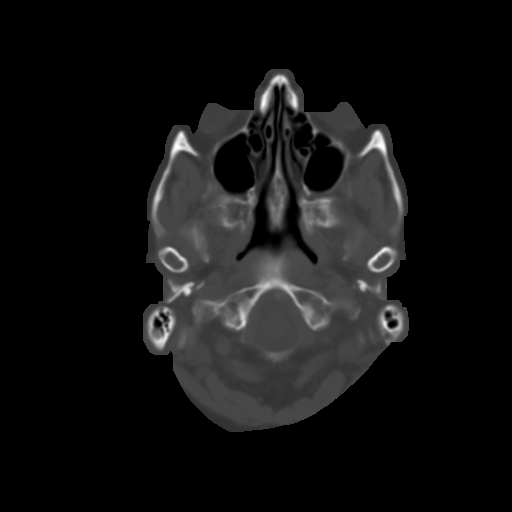
[im 6/30  brain]
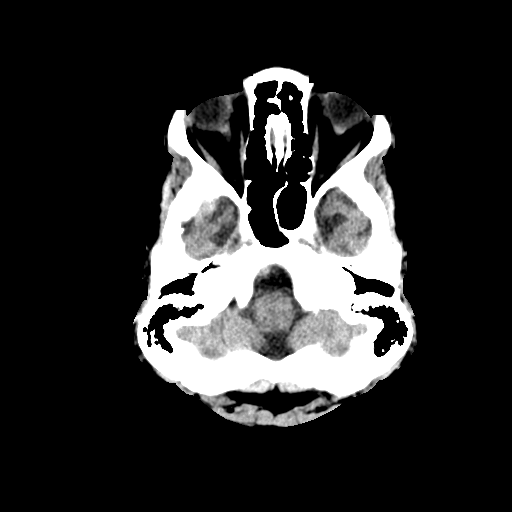
[im 9/30  brain]
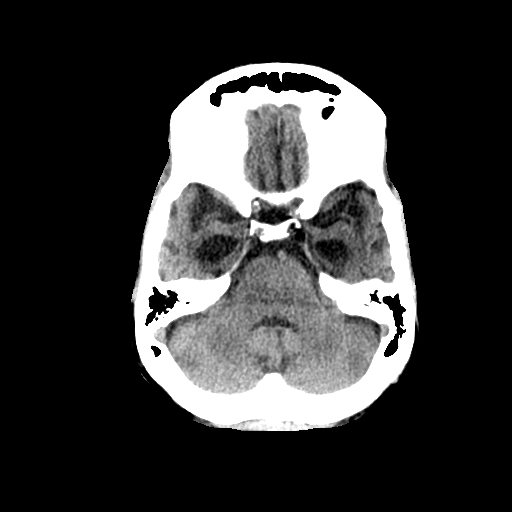
[im 12/30  brain]
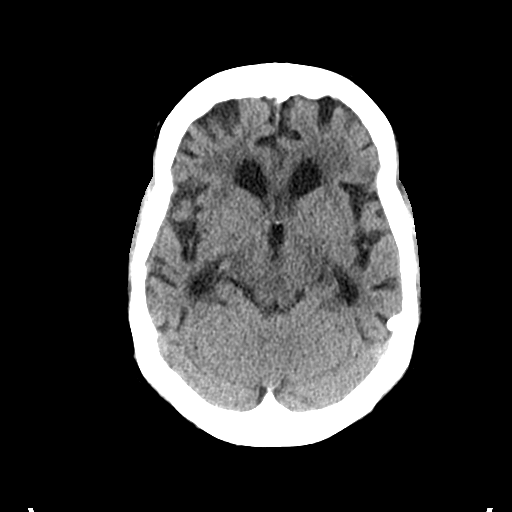
[im 16/30  brain]
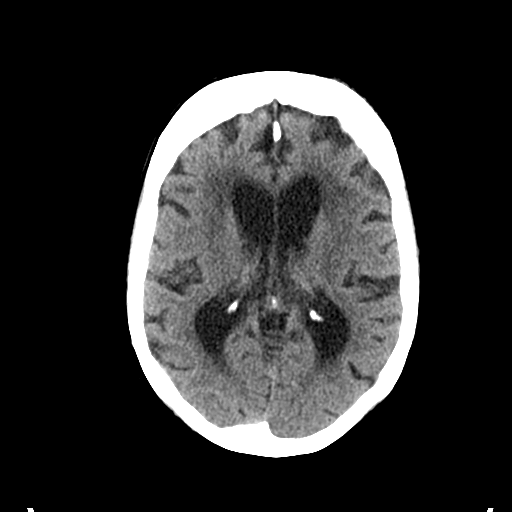
[im 16/30  bone]
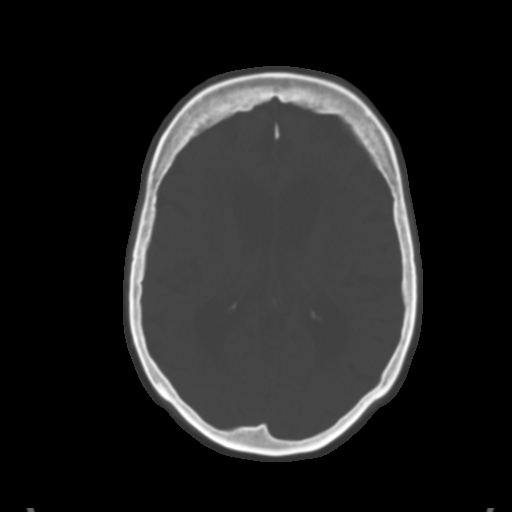
[im 19/30  brain]
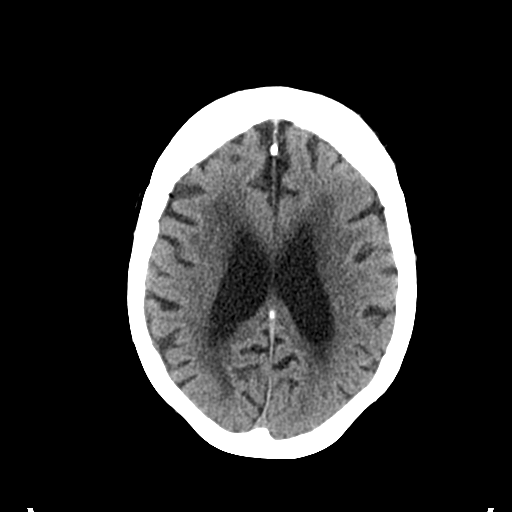
[im 22/30  brain]
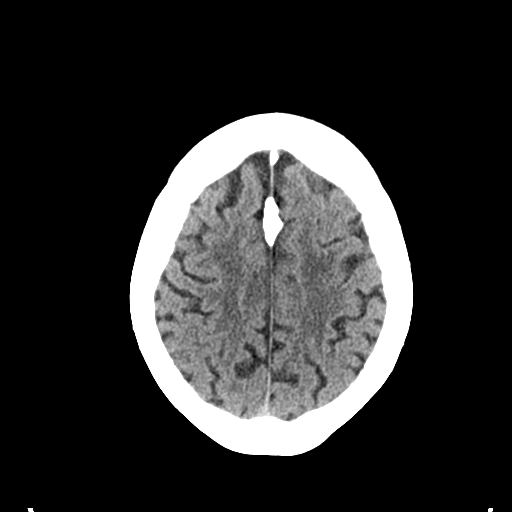
[im 25/30  brain]
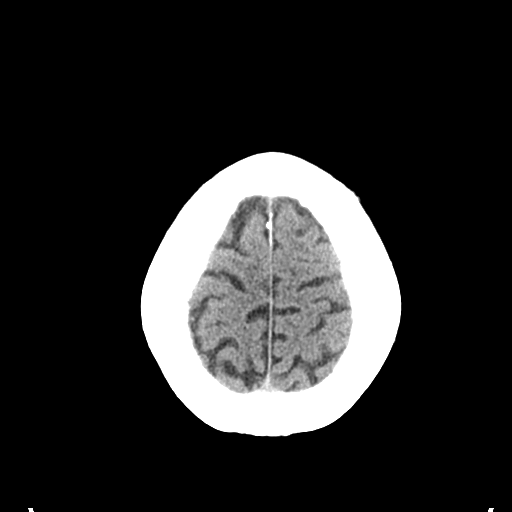
[im 28/30  brain]
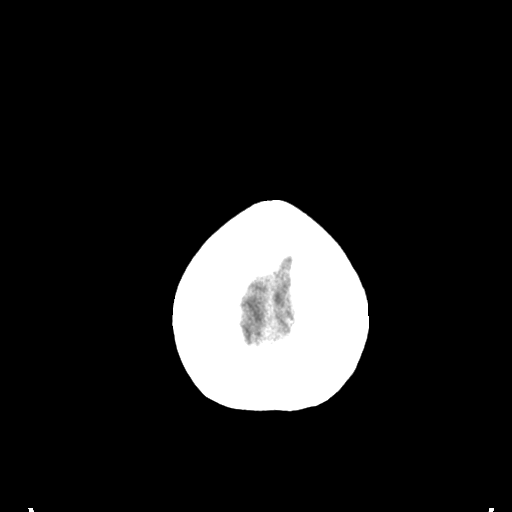
[im 28/30  bone]
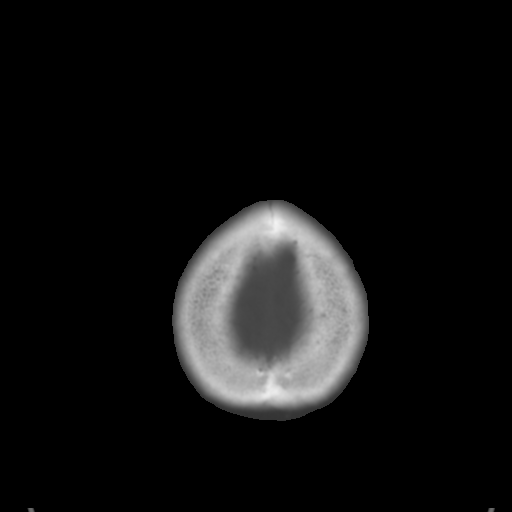

[Series 5: coronal soft tissue · coronal · 0.32mm/px · 3 of 71 slices shown]
[im 24/71  brain]
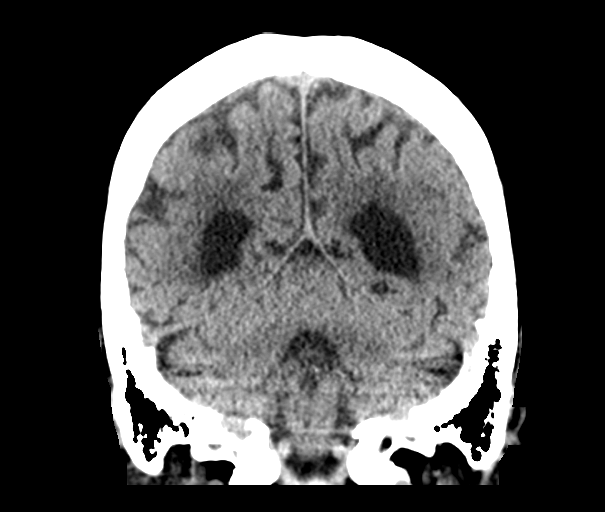
[im 32/71  brain]
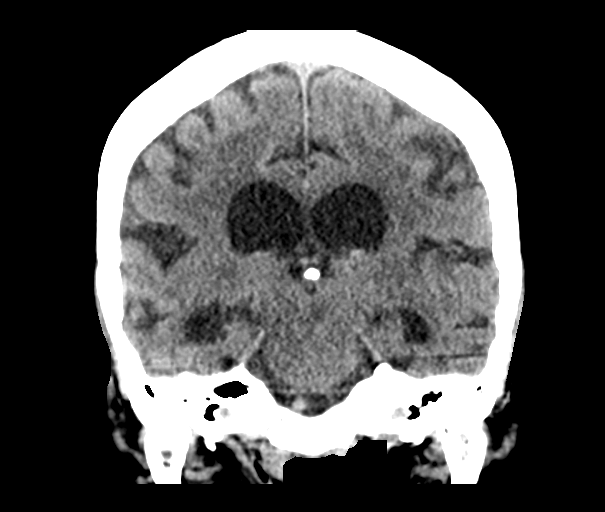
[im 39/71  brain]
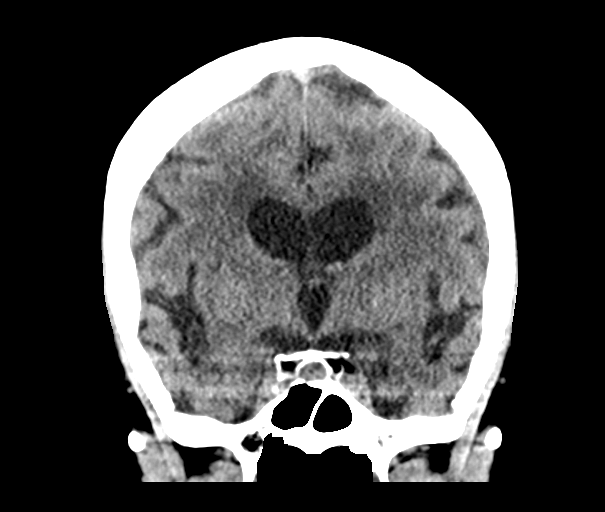

[Series 6: sagittal soft tissue · sagittal · 0.33mm/px · 3 of 54 slices shown]
[im 18/54  brain]
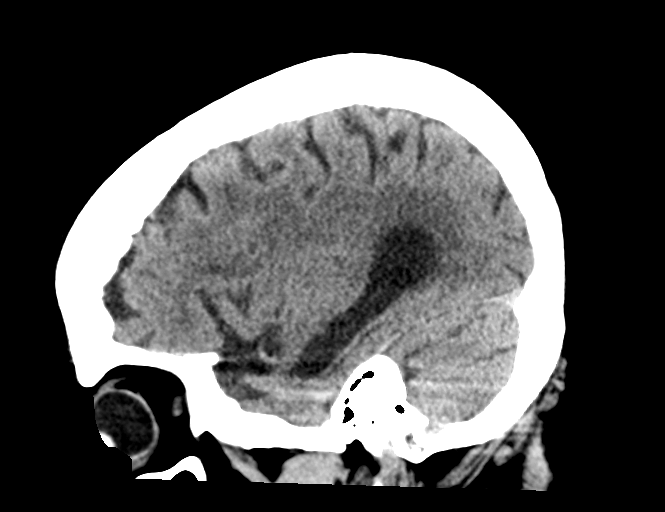
[im 27/54  brain]
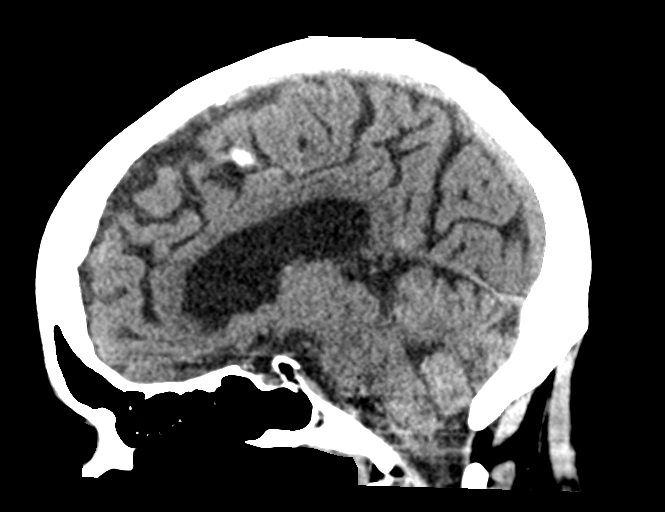
[im 36/54  brain]
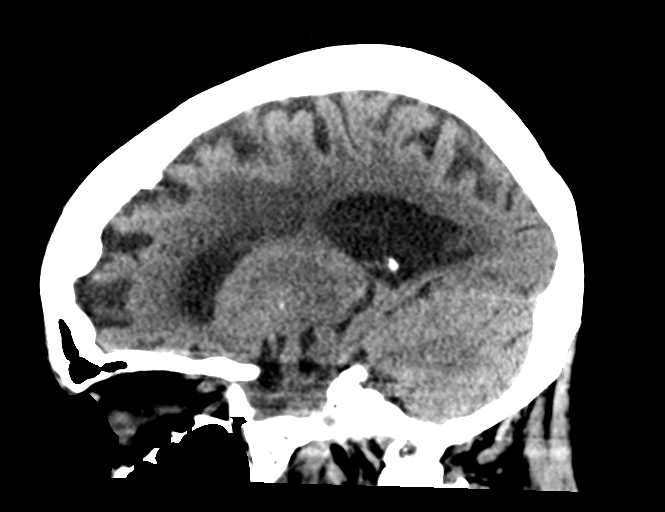

[15 of 47 positions shown; findings below may reference images not displayed]

FINDINGS: Brain: No acute territorial infarction, hemorrhage or intracranial
mass. Moderate atrophy. Fairly extensive white matter hypodensity
likely chronic small vessel ischemic change, progressed compared to
prior MRI. Ventricular enlargement likely due to atrophy progression

Vascular: No hyperdense vessel.  Carotid vascular calcification.

Skull: Normal. Negative for fracture or focal lesion.

Sinuses/Orbits: No acute finding.

Other: None
IMPRESSION: 1. No CT evidence for acute intracranial abnormality.
2. Atrophy and chronic small vessel ischemic changes of the white
matter.

## 2022-06-19 ENCOUNTER — Ambulatory Visit: Payer: Medicaid Other | Admitting: Podiatry

## 2022-06-26 ENCOUNTER — Ambulatory Visit: Payer: Medicaid Other | Admitting: Podiatry

## 2022-08-07 ENCOUNTER — Ambulatory Visit: Payer: Medicaid Other | Admitting: Podiatry

## 2022-08-27 ENCOUNTER — Encounter: Payer: Self-pay | Admitting: Podiatry

## 2022-08-27 ENCOUNTER — Ambulatory Visit (INDEPENDENT_AMBULATORY_CARE_PROVIDER_SITE_OTHER): Payer: 59 | Admitting: Podiatry

## 2022-08-27 DIAGNOSIS — N183 Chronic kidney disease, stage 3 unspecified: Secondary | ICD-10-CM

## 2022-08-27 DIAGNOSIS — M79609 Pain in unspecified limb: Secondary | ICD-10-CM | POA: Diagnosis not present

## 2022-08-27 DIAGNOSIS — B351 Tinea unguium: Secondary | ICD-10-CM

## 2022-08-27 NOTE — Progress Notes (Signed)
This patient returns to my office for at risk foot care.  This patient requires this care by a professional since this patient will be at risk due to having CKD.  This patient is unable to cut nails herself since the patient cannot reach her nails.These nails are painful walking and wearing shoes. She presents with her sister. This patient presents for at risk foot care today.  General Appearance  Alert, conversant and in no acute stress.  Vascular  Dorsalis pedis and posterior tibial  pulses are palpable  bilaterally.  Capillary return is within normal limits  bilaterally. Temperature is within normal limits  bilaterally.  Neurologic  Senn-Weinstein monofilament wire test within normal limits  bilaterally. Muscle power within normal limits bilaterally.  Nails Thick disfigured discolored nails with subungual debris  from hallux to fifth toes bilaterally. No evidence of bacterial infection or drainage bilaterally.  Orthopedic  No limitations of motion  feet .  No crepitus or effusions noted.  No bony pathology or digital deformities noted.  Skin  normotropic skin with no porokeratosis noted bilaterally.  No signs of infections or ulcers noted.     Onychomycosis  Pain in right toes  Pain in left toes  Consent was obtained for treatment procedures.   Mechanical debridement of nails 1-5  bilaterally performed with a nail nipper.  Filed with dremel without incident.    Return office visit  3 months                    Told patient to return for periodic foot care and evaluation due to potential at risk complications.   Gardiner Barefoot DPM

## 2022-10-30 ENCOUNTER — Ambulatory Visit (INDEPENDENT_AMBULATORY_CARE_PROVIDER_SITE_OTHER): Payer: 59 | Admitting: Podiatry

## 2022-10-30 DIAGNOSIS — S90112A Contusion of left great toe without damage to nail, initial encounter: Secondary | ICD-10-CM

## 2022-10-30 NOTE — Progress Notes (Signed)
  Subjective:  Patient ID: Colleen David, female    DOB: March 06, 1945,  MRN: [id number]  Chief Complaint  Patient presents with   Toe Pain    Left foot toe pain    77 y.o. female presents with the above complaint.  Patient presents with left great toenail contusion.  Patient states that it may have been started there is mild bleeding.  He went to get evaluated he states that he was starting a little per provider that he knows about.  Pain scale 0 out of 10 dull aching nature.  He is working with moving.  Has not seen anyone else for the same     Review of Systems: Negative except as noted in the HPI. Denies N/V/F/Ch.  Past Medical History:  Diagnosis Date   Dementia (HCC)    Hyperlipidemia    Low vitamin B12 level    Nicotine dependence    Restrictive airway disease     Current Outpatient Medications:    albuterol (PROVENTIL) (2.5 MG/3ML) 0.083% nebulizer solution, Inhale into the lungs., Disp: , Rfl:    aspirin EC 81 MG tablet, Take 81 mg by mouth at bedtime. , Disp: , Rfl:    atorvastatin (LIPITOR) 10 MG tablet, Take 10 mg by mouth at bedtime. , Disp: , Rfl:    Ferrous Sulfate (IRON PO), Take by mouth., Disp: , Rfl:    Memantine HCl-Donepezil HCl 28-10 MG CP24, Take 1 capsule by mouth at bedtime. , Disp: , Rfl:   Social History   Tobacco Use  Smoking Status Former   Current packs/day: 0.00   Average packs/day: 0.5 packs/day for 55.0 years (27.5 ttl pk-yrs)   Types: Cigarettes   Start date: 69   Quit date: 2020   Years since quitting: 4.7  Smokeless Tobacco Never    No Known Allergies Objective:  There were no vitals filed for this visit. There is no height or weight on file to calculate BMI. Constitutional Well developed. Well nourished.  Vascular Dorsalis pedis pulses palpable bilaterally. Posterior tibial pulses palpable bilaterally. Capillary refill normal to all digits.  No cyanosis or clubbing noted. Pedal hair growth normal.  Neurologic Normal  speech. Oriented to person, place, and time. Epicritic sensation to light touch grossly present bilaterally.  Dermatologic Nails well groomed and normal in appearance. No open wounds. No skin lesions.  Orthopedic: Mild pain on palpation to the left great toe.  The nailbed is well adhered to the underlying nail.  Mild hematoma noted less than 10% of the nailbed block.  No signs of infection.   Radiographs: None Assessment:   1. Contusion of left great toe without damage to nail, initial encounter    Plan:  Patient was evaluated and treated and all questions answered.  Left great toenail contusion without damage to the nail -All questions and concerns were discussed with the patient extensive detail.  At this time there is no damage to the nail and the nail is well adhered to the underlying nailbed.  There is minimal hematoma.  Therefore patient will benefit from primarily.  The nail along.  I discussed with him that in the future he can follow-up once.  Patient agrees with plan would like to focus mostly on conservative care without removal.  No follow-ups on file.

## 2023-02-25 ENCOUNTER — Ambulatory Visit: Payer: 59 | Admitting: Podiatry

## 2023-03-04 ENCOUNTER — Encounter: Payer: Self-pay | Admitting: Podiatry

## 2023-03-04 ENCOUNTER — Ambulatory Visit (INDEPENDENT_AMBULATORY_CARE_PROVIDER_SITE_OTHER): Payer: 59 | Admitting: Podiatry

## 2023-03-04 DIAGNOSIS — N183 Chronic kidney disease, stage 3 unspecified: Secondary | ICD-10-CM | POA: Diagnosis not present

## 2023-03-04 DIAGNOSIS — B351 Tinea unguium: Secondary | ICD-10-CM | POA: Diagnosis not present

## 2023-03-04 DIAGNOSIS — M79609 Pain in unspecified limb: Secondary | ICD-10-CM | POA: Diagnosis not present

## 2023-03-04 DIAGNOSIS — M79676 Pain in unspecified toe(s): Secondary | ICD-10-CM | POA: Diagnosis not present

## 2023-03-04 NOTE — Progress Notes (Signed)
This patient returns to my office for at risk foot care.  This patient requires this care by a professional since this patient will be at risk due to having CKD.  This patient is unable to cut nails herself since the patient cannot reach her nails.These nails are painful walking and wearing shoes. She presents with her sister. This patient presents for at risk foot care today.  General Appearance  Alert, conversant and in no acute stress.  Vascular  Dorsalis pedis and posterior tibial  pulses are palpable  bilaterally.  Capillary return is within normal limits  bilaterally. Temperature is within normal limits  bilaterally.  Neurologic  Senn-Weinstein monofilament wire test within normal limits  bilaterally. Muscle power within normal limits bilaterally.  Nails Thick disfigured discolored nails with subungual debris  from hallux to fifth toes bilaterally. No evidence of bacterial infection or drainage bilaterally.  Orthopedic  No limitations of motion  feet .  No crepitus or effusions noted.  No bony pathology or digital deformities noted.  Skin  normotropic skin with no porokeratosis noted bilaterally.  No signs of infections or ulcers noted.     Onychomycosis  Pain in right toes  Pain in left toes  Consent was obtained for treatment procedures.   Mechanical debridement of nails 1-5  bilaterally performed with a nail nipper.  Filed with dremel without incident.    Return office visit  4  months                    Told patient to return for periodic foot care and evaluation due to potential at risk complications.   Helane Gunther DPM

## 2023-03-05 ENCOUNTER — Emergency Department (HOSPITAL_COMMUNITY): Payer: 59

## 2023-03-05 ENCOUNTER — Emergency Department (HOSPITAL_COMMUNITY)
Admission: EM | Admit: 2023-03-05 | Discharge: 2023-03-05 | Disposition: A | Discharge status: Home / Self Care | Payer: 59 | Attending: Emergency Medicine | Admitting: Emergency Medicine

## 2023-03-05 DIAGNOSIS — Z7982 Long term (current) use of aspirin: Secondary | ICD-10-CM | POA: Diagnosis not present

## 2023-03-05 DIAGNOSIS — R42 Dizziness and giddiness: Secondary | ICD-10-CM | POA: Insufficient documentation

## 2023-03-05 DIAGNOSIS — W19XXXA Unspecified fall, initial encounter: Secondary | ICD-10-CM | POA: Insufficient documentation

## 2023-03-05 DIAGNOSIS — F0390 Unspecified dementia without behavioral disturbance: Secondary | ICD-10-CM | POA: Insufficient documentation

## 2023-03-05 LAB — CBC WITH DIFFERENTIAL/PLATELET
Abs Immature Granulocytes: 0.02 10*3/uL (ref 0.00–0.07)
Basophils Absolute: 0.1 10*3/uL (ref 0.0–0.1)
Basophils Relative: 1 %
Eosinophils Absolute: 0.3 10*3/uL (ref 0.0–0.5)
Eosinophils Relative: 4 %
HCT: 38.6 % (ref 36.0–46.0)
Hemoglobin: 12.4 g/dL (ref 12.0–15.0)
Immature Granulocytes: 0 %
Lymphocytes Relative: 41 %
Lymphs Abs: 2.9 10*3/uL (ref 0.7–4.0)
MCH: 30.3 pg (ref 26.0–34.0)
MCHC: 32.1 g/dL (ref 30.0–36.0)
MCV: 94.4 fL (ref 80.0–100.0)
Monocytes Absolute: 0.5 10*3/uL (ref 0.1–1.0)
Monocytes Relative: 7 %
Neutro Abs: 3.3 10*3/uL (ref 1.7–7.7)
Neutrophils Relative %: 47 %
Platelets: 283 10*3/uL (ref 150–400)
RBC: 4.09 MIL/uL (ref 3.87–5.11)
RDW: 12.3 % (ref 11.5–15.5)
WBC: 7 10*3/uL (ref 4.0–10.5)
nRBC: 0 % (ref 0.0–0.2)

## 2023-03-05 LAB — COMPREHENSIVE METABOLIC PANEL
ALT: 10 U/L (ref 0–44)
AST: 19 U/L (ref 15–41)
Albumin: 3.1 g/dL — ABNORMAL LOW (ref 3.5–5.0)
Alkaline Phosphatase: 71 U/L (ref 38–126)
Anion gap: 9 (ref 5–15)
BUN: 12 mg/dL (ref 8–23)
CO2: 24 mmol/L (ref 22–32)
Calcium: 8.6 mg/dL — ABNORMAL LOW (ref 8.9–10.3)
Chloride: 108 mmol/L (ref 98–111)
Creatinine, Ser: 1.09 mg/dL — ABNORMAL HIGH (ref 0.44–1.00)
GFR, Estimated: 53 mL/min — ABNORMAL LOW (ref >60)
Glucose, Bld: 95 mg/dL (ref 70–99)
Potassium: 3.8 mmol/L (ref 3.5–5.1)
Sodium: 141 mmol/L (ref 135–145)
Total Bilirubin: 1.3 mg/dL — ABNORMAL HIGH (ref 0.0–1.2)
Total Protein: 6.4 g/dL — ABNORMAL LOW (ref 6.5–8.1)

## 2023-03-05 MED ORDER — SODIUM CHLORIDE 0.9 % IV BOLUS
1000.0000 mL | Freq: Once | INTRAVENOUS | Status: AC
  Administered 2023-03-05: 1000 mL via INTRAVENOUS

## 2023-03-05 NOTE — ED Triage Notes (Signed)
Pt arrived via [facility] from home for fall did strike head, near syncope post fall, witnessed by family, no obvious deformities. Hypotensive inially by EMS at 80/54, improved with 500 NS to 100/74. Pt has advanced dementia, responds to name - currently at baseline. 20LAC. EMS report pt would not tolerate Ccollar. No injuries, deformities noted. 20g LAC.  PTA EMS Vitals HR 60 NS SPO2 94% RA BP 80/54 -> 100/74 RR 16

## 2023-03-05 NOTE — ED Provider Notes (Signed)
[place] EMERGENCY DEPARTMENT AT [facility] Provider Note   CSN: [id number] Arrival date & time: 03/05/23  1154     History  Chief Complaint  Patient presents with   [facility] Syncope    Colleen David is a 78 y.o. female.  HPI Presents via EMS after a fall.  Patient is dementia, AO x 1 or 2 baseline.  EMS reports patient had a witnessed fall at home.  Patient herself does answer questions about her current condition, seemingly appropriately, though very briefly.  She denies pain dyspnea nausea.  EMS reports patient was hypotensive on arrival, improved with minimal fluid resuscitation.  Patient's son eventually arrived.  He notes that she has been essentially normal until today, but did have an episode of lightheadedness, after showering.    Home Medications Prior to Admission medications   Medication Sig Start Date End Date Taking? Authorizing Provider  albuterol (PROVENTIL) (2.5 MG/3ML) 0.083% nebulizer solution Inhale into the lungs. 02/26/22   [provider]  aspirin EC 81 MG tablet Take 81 mg by mouth at bedtime.     [provider]  atorvastatin (LIPITOR) 10 MG tablet Take 10 mg by mouth at bedtime.     [provider]  donepezil (ARICEPT) 10 MG tablet Take 10 mg by mouth at bedtime. 01/07/22   [provider]  Ferrous Sulfate (IRON PO) Take by mouth. 10/08/19   [provider]  memantine (NAMENDA XR) 28 MG CP24 24 hr capsule Take 28 mg by mouth at bedtime. 03/06/22   [provider]  Memantine HCl-Donepezil HCl 28-10 MG CP24 Take 1 capsule by mouth at bedtime.     [provider]      Allergies    Patient has no known allergies.    Review of Systems   Review of Systems  Physical Exam Updated Vital Signs BP 114/66   Pulse 64   Temp (!) 96.7 F (35.9 C) (Axillary)   Resp 15   SpO2 95%  Physical Exam Vitals and nursing note reviewed.  Constitutional:      General: She is not in acute  distress.    Appearance: She is well-developed.  HENT:     Head: Normocephalic and atraumatic.  Eyes:     Conjunctiva/sclera: Conjunctivae normal.  Cardiovascular:     Rate and Rhythm: Normal rate and regular rhythm.  Pulmonary:     Effort: Pulmonary effort is normal. No respiratory distress.     Breath sounds: Normal breath sounds. No stridor.  Abdominal:     General: There is no distension.  Skin:    General: Skin is warm and dry.  Neurological:     Mental Status: She is alert.     Motor: Atrophy present.  Psychiatric:        Cognition and Memory: Cognition is impaired. Memory is impaired.     ED Results / Procedures / Treatments   Labs (all labs ordered are listed, but only abnormal results are displayed) Labs Reviewed  COMPREHENSIVE METABOLIC PANEL - Abnormal; Notable for the following components:      Result Value   Creatinine, Ser 1.09 (*)    Calcium 8.6 (*)    Total Protein 6.4 (*)    Albumin 3.1 (*)    Total Bilirubin 1.3 (*)    GFR, Estimated 53 (*)    All other components within normal limits  CBC WITH DIFFERENTIAL/PLATELET    EKG EKG Interpretation Date/Time:  Wednesday March 05 2023 12:18:02 EST Ventricular Rate:  73 PR Interval:  183 QRS Duration:  128 QT Interval:  466 QTC Calculation: 514 R Axis:   264  Text Interpretation: Sinus rhythm RBBB and LAFB ST-t wave abnormality No significant change since last tracing Abnormal ECG Confirmed by Gerhard Munch [phone]) on 03/05/2023 2:18:57 PM  Radiology CT Cervical Spine Wo Contrast Result Date: 03/05/2023 CLINICAL DATA:  Fall with head strike, near syncopal episode EXAM: CT HEAD WITHOUT CONTRAST CT CERVICAL SPINE WITHOUT CONTRAST TECHNIQUE: Multidetector CT imaging of the head and cervical spine was performed following the standard protocol without intravenous contrast. Multiplanar CT image reconstructions of the cervical spine were also generated. RADIATION DOSE REDUCTION: This exam was performed  according to the departmental dose-optimization program which includes automated exposure control, adjustment of the mA and/or kV according to patient size and/or use of iterative reconstruction technique. COMPARISON:  12/14/2020 CT head, no prior CT cervical spine FINDINGS: CT HEAD FINDINGS Brain: No evidence of acute infarct, hemorrhage, mass, mass effect, or midline shift. No hydrocephalus or extra-axial fluid collection. Advanced cerebral volume loss for age, with ex vacuo dilatation of the ventricles. Periventricular white matter changes, likely the sequela of chronic small vessel ischemic disease. Vascular: No hyperdense vessel. Skull: Negative for fracture or focal lesion. Hyperostosis frontalis. Sinuses/Orbits: Bubbly fluid in the left sphenoid sinus. No acute finding in the orbits. Other: The mastoid air cells are well aerated. CT CERVICAL SPINE FINDINGS Alignment: No traumatic listhesis. Skull base and vertebrae: No acute fracture or suspicious osseous lesion. Soft tissues and spinal canal: No prevertebral fluid or swelling. No visible canal hematoma. Disc levels: Degenerative changes in the cervical spine.No high-grade spinal canal stenosis. Upper chest: No focal pulmonary opacity or pleural effusion. Advanced emphysema. IMPRESSION: 1. No acute intracranial process. 2. No acute fracture or traumatic listhesis in the cervical spine. 3. Bubbly fluid in the left sphenoid sinus, which can be seen in the setting of acute sinusitis. 4. Advanced emphysema. Emphysema (ICD10-J43.9). Electronically Signed   By: Wiliam Ke M.D.   On: 03/05/2023 14:09   CT Head Wo Contrast Result Date: 03/05/2023 CLINICAL DATA:  Fall with head strike, near syncopal episode EXAM: CT HEAD WITHOUT CONTRAST CT CERVICAL SPINE WITHOUT CONTRAST TECHNIQUE: Multidetector CT imaging of the head and cervical spine was performed following the standard protocol without intravenous contrast. Multiplanar CT image reconstructions of the  cervical spine were also generated. RADIATION DOSE REDUCTION: This exam was performed according to the departmental dose-optimization program which includes automated exposure control, adjustment of the mA and/or kV according to patient size and/or use of iterative reconstruction technique. COMPARISON:  12/14/2020 CT head, no prior CT cervical spine FINDINGS: CT HEAD FINDINGS Brain: No evidence of acute infarct, hemorrhage, mass, mass effect, or midline shift. No hydrocephalus or extra-axial fluid collection. Advanced cerebral volume loss for age, with ex vacuo dilatation of the ventricles. Periventricular white matter changes, likely the sequela of chronic small vessel ischemic disease. Vascular: No hyperdense vessel. Skull: Negative for fracture or focal lesion. Hyperostosis frontalis. Sinuses/Orbits: Bubbly fluid in the left sphenoid sinus. No acute finding in the orbits. Other: The mastoid air cells are well aerated. CT CERVICAL SPINE FINDINGS Alignment: No traumatic listhesis. Skull base and vertebrae: No acute fracture or suspicious osseous lesion. Soft tissues and spinal canal: No prevertebral fluid or swelling. No visible canal hematoma. Disc levels: Degenerative changes in the cervical spine.No high-grade spinal canal stenosis. Upper chest: No focal pulmonary opacity or pleural effusion. Advanced emphysema. IMPRESSION: 1.  No acute intracranial process. 2. No acute fracture or traumatic listhesis in the cervical spine. 3. Bubbly fluid in the left sphenoid sinus, which can be seen in the setting of acute sinusitis. 4. Advanced emphysema. Emphysema (ICD10-J43.9). Electronically Signed   By: Wiliam Ke M.D.   On: 03/05/2023 14:09    Procedures Procedures    Medications Ordered in ED Medications  sodium chloride 0.9 % bolus 1,000 mL (1,000 mLs Intravenous New Bag/Given 03/05/23 1219)    ED Course/ Medical Decision Making/ A&P                                 Medical Decision Making Elderly  female with dementia presents from home after a fall.  Suspicion for orthostasis contributing to fall given reported hypotension.  On arrival the patient's blood pressure has normalized, she denies any complaints, though her history is noted to be prohibitive. With reported hypotension differential including dehydration, infection, bacteremia, sepsis, intracranial abnormality all considered. Cardiac 65 sinus normal Pulse ox 100% room air normal   Amount and/or Complexity of Data Reviewed Independent Historian: EMS    Details: And son after his arrival External Data Reviewed: notes. Labs: ordered. Decision-making details documented in ED Course. Radiology: ordered and independent interpretation performed. Decision-making details documented in ED Course. ECG/medicine tests: ordered and independent interpretation performed. Decision-making details documented in ED Course.  Risk Prescription drug management. Decision regarding hospitalization. Diagnosis or treatment significantly limited by social determinants of health.   3:18 PM Patient remains in no distress, presentation, results all discussed with the patient's son.  No evidence for acute new phenomenon, patient discharged in stable condition.        Final Clinical Impression(s) / ED Diagnoses Final diagnoses:  Fall, initial encounter    Rx / DC Orders ED Discharge Orders     None         Gerhard Munch, MD 03/05/23 [phone]

## 2023-03-05 NOTE — Discharge Instructions (Signed)
As discussed, your evaluation today has been largely reassuring.  But, it is important that you monitor your condition carefully, and do not hesitate to return to the ED if you develop new, or concerning changes in your condition. ? ?Otherwise, please follow-up with your physician for appropriate ongoing care. ? ?

## 2023-07-02 ENCOUNTER — Ambulatory Visit: Payer: 59 | Admitting: Podiatry

## 2023-07-09 ENCOUNTER — Ambulatory Visit: Admitting: Podiatry

## 2023-09-03 ENCOUNTER — Ambulatory Visit: Admitting: Podiatry

## 2023-09-30 ENCOUNTER — Ambulatory Visit: Admitting: Podiatry

## 2023-10-20 ENCOUNTER — Encounter: Payer: Self-pay | Admitting: Podiatry

## 2023-10-20 ENCOUNTER — Ambulatory Visit (INDEPENDENT_AMBULATORY_CARE_PROVIDER_SITE_OTHER): Admitting: Podiatry

## 2023-10-20 DIAGNOSIS — N183 Chronic kidney disease, stage 3 unspecified: Secondary | ICD-10-CM

## 2023-10-20 DIAGNOSIS — M79609 Pain in unspecified limb: Secondary | ICD-10-CM

## 2023-10-20 DIAGNOSIS — B351 Tinea unguium: Secondary | ICD-10-CM | POA: Diagnosis not present

## 2023-10-20 DIAGNOSIS — F0150 Vascular dementia without behavioral disturbance: Secondary | ICD-10-CM

## 2023-10-20 NOTE — Progress Notes (Signed)
 This patient returns to my office for at risk foot care.  This patient requires this care by a professional since this patient will be at risk due to having CKD.  This patient is unable to cut nails herself since the patient cannot reach her nails.These nails are painful walking and wearing shoes. She presents with her sister. This patient presents for at risk foot care today.  General Appearance  Alert, conversant and in no acute stress.  Vascular  Dorsalis pedis and posterior tibial  pulses are palpable  bilaterally.  Capillary return is within normal limits  bilaterally. Temperature is within normal limits  bilaterally.  Neurologic  Senn-Weinstein monofilament wire test within normal limits  bilaterally. Muscle power within normal limits bilaterally.  Nails Thick disfigured discolored nails with subungual debris  from hallux to fifth toes bilaterally. No evidence of bacterial infection or drainage bilaterally.  Orthopedic  No limitations of motion  feet .  No crepitus or effusions noted.  No bony pathology or digital deformities noted.  Skin  normotropic skin with no porokeratosis noted bilaterally.  No signs of infections or ulcers noted.     Onychomycosis  Pain in right toes  Pain in left toes  Consent was obtained for treatment procedures.   Mechanical debridement of nails 1-5  bilaterally performed with a nail nipper.  Filed with dremel without incident.    Return office visit  4  months                    Told patient to return for periodic foot care and evaluation due to potential at risk complications.   Helane Gunther DPM

## 2024-01-19 ENCOUNTER — Ambulatory Visit: Admitting: Podiatry

## 2024-02-17 ENCOUNTER — Ambulatory Visit: Admitting: Podiatry
